# Patient Record
Sex: Male | Born: 1960 | Race: White | Hispanic: No | Marital: Married | Smoking: Light tobacco smoker
Health system: Southern US, Community
[De-identification: ages and names within clinical notes are randomized; demographics above are authoritative.]

## PROBLEM LIST (undated history)

## (undated) DIAGNOSIS — K219 Gastro-esophageal reflux disease without esophagitis: Secondary | ICD-10-CM

## (undated) DIAGNOSIS — D696 Thrombocytopenia, unspecified: Secondary | ICD-10-CM

## (undated) DIAGNOSIS — K648 Other hemorrhoids: Secondary | ICD-10-CM

## (undated) DIAGNOSIS — F329 Major depressive disorder, single episode, unspecified: Secondary | ICD-10-CM

## (undated) DIAGNOSIS — K625 Hemorrhage of anus and rectum: Secondary | ICD-10-CM

## (undated) DIAGNOSIS — G894 Chronic pain syndrome: Secondary | ICD-10-CM

## (undated) DIAGNOSIS — F319 Bipolar disorder, unspecified: Secondary | ICD-10-CM

## (undated) DIAGNOSIS — K8020 Calculus of gallbladder without cholecystitis without obstruction: Secondary | ICD-10-CM

## (undated) DIAGNOSIS — F419 Anxiety disorder, unspecified: Secondary | ICD-10-CM

## (undated) DIAGNOSIS — N400 Enlarged prostate without lower urinary tract symptoms: Secondary | ICD-10-CM

## (undated) DIAGNOSIS — E785 Hyperlipidemia, unspecified: Secondary | ICD-10-CM

## (undated) DIAGNOSIS — B182 Chronic viral hepatitis C: Secondary | ICD-10-CM

## (undated) DIAGNOSIS — K7460 Unspecified cirrhosis of liver: Secondary | ICD-10-CM

## (undated) DIAGNOSIS — N429 Disorder of prostate, unspecified: Secondary | ICD-10-CM

## (undated) DIAGNOSIS — F489 Nonpsychotic mental disorder, unspecified: Secondary | ICD-10-CM

## (undated) DIAGNOSIS — F1020 Alcohol dependence, uncomplicated: Secondary | ICD-10-CM

## (undated) DIAGNOSIS — I10 Essential (primary) hypertension: Secondary | ICD-10-CM

## (undated) DIAGNOSIS — N529 Male erectile dysfunction, unspecified: Secondary | ICD-10-CM

## (undated) DIAGNOSIS — E079 Disorder of thyroid, unspecified: Secondary | ICD-10-CM

## (undated) DIAGNOSIS — T7840XA Allergy, unspecified, initial encounter: Secondary | ICD-10-CM

## (undated) DIAGNOSIS — E229 Hyperfunction of pituitary gland, unspecified: Secondary | ICD-10-CM

## (undated) DIAGNOSIS — F1910 Other psychoactive substance abuse, uncomplicated: Secondary | ICD-10-CM

## (undated) DIAGNOSIS — E291 Testicular hypofunction: Secondary | ICD-10-CM

## (undated) DIAGNOSIS — D7589 Other specified diseases of blood and blood-forming organs: Secondary | ICD-10-CM

## (undated) DIAGNOSIS — F32A Depression, unspecified: Secondary | ICD-10-CM

## (undated) DIAGNOSIS — L309 Dermatitis, unspecified: Secondary | ICD-10-CM

## (undated) HISTORY — DX: Hemorrhage of anus and rectum: K62.5

## (undated) HISTORY — DX: Thrombocytopenia, unspecified: D69.6

## (undated) HISTORY — DX: Male erectile dysfunction, unspecified: N52.9

## (undated) HISTORY — DX: Hyperlipidemia, unspecified: E78.5

## (undated) HISTORY — DX: Disorder of thyroid, unspecified: E07.9

## (undated) HISTORY — DX: Disorder of prostate, unspecified: N42.9

## (undated) HISTORY — DX: Dermatitis, unspecified: L30.9

## (undated) HISTORY — DX: Other hemorrhoids: K64.8

## (undated) HISTORY — DX: Gastro-esophageal reflux disease without esophagitis: K21.9

## (undated) HISTORY — DX: Testicular hypofunction: E29.1

## (undated) HISTORY — DX: Alcohol dependence, uncomplicated: F10.20

## (undated) HISTORY — DX: Hyperfunction of pituitary gland, unspecified: E22.9

## (undated) HISTORY — DX: Chronic pain syndrome: G89.4

## (undated) HISTORY — DX: Other psychoactive substance abuse, uncomplicated: F19.10

## (undated) HISTORY — DX: Nonpsychotic mental disorder, unspecified: F48.9

## (undated) HISTORY — DX: Allergy, unspecified, initial encounter: T78.40XA

## (undated) HISTORY — DX: Other specified diseases of blood and blood-forming organs: D75.89

## (undated) HISTORY — DX: Calculus of gallbladder without cholecystitis without obstruction: K80.20

## (undated) HISTORY — DX: Bipolar disorder, unspecified: F31.9

## (undated) HISTORY — PX: HAND SURGERY: SHX662

## (undated) HISTORY — DX: Unspecified cirrhosis of liver: K74.60

## (undated) HISTORY — DX: Chronic viral hepatitis C: B18.2

## (undated) HISTORY — PX: OTHER SURGICAL HISTORY: SHX169

## (undated) HISTORY — DX: Benign prostatic hyperplasia without lower urinary tract symptoms: N40.0

---

## 2006-11-14 ENCOUNTER — Inpatient Hospital Stay: Payer: Self-pay | Admitting: Unknown Physician Specialty

## 2006-11-14 ENCOUNTER — Ambulatory Visit: Payer: Self-pay | Admitting: General Surgery

## 2006-12-26 ENCOUNTER — Emergency Department: Payer: Self-pay | Admitting: Unknown Physician Specialty

## 2008-09-19 ENCOUNTER — Emergency Department: Payer: Self-pay | Admitting: Emergency Medicine

## 2009-07-11 ENCOUNTER — Emergency Department: Payer: Self-pay | Admitting: Emergency Medicine

## 2009-12-25 ENCOUNTER — Emergency Department: Payer: Self-pay | Admitting: Emergency Medicine

## 2010-01-27 ENCOUNTER — Emergency Department: Payer: Self-pay | Admitting: Emergency Medicine

## 2010-03-27 ENCOUNTER — Other Ambulatory Visit: Payer: Self-pay | Admitting: Internal Medicine

## 2010-04-22 ENCOUNTER — Emergency Department: Payer: Self-pay | Admitting: Emergency Medicine

## 2010-08-21 ENCOUNTER — Inpatient Hospital Stay: Payer: Self-pay | Admitting: Psychiatry

## 2011-09-13 LAB — COMPREHENSIVE METABOLIC PANEL
Alkaline Phosphatase: 60 U/L (ref 50–136)
Anion Gap: 14 (ref 7–16)
BUN: 12 mg/dL (ref 7–18)
Calcium, Total: 7.4 mg/dL — ABNORMAL LOW (ref 8.5–10.1)
Chloride: 105 mmol/L (ref 98–107)
Co2: 23 mmol/L (ref 21–32)
Creatinine: 0.93 mg/dL (ref 0.60–1.30)
EGFR (African American): >60
Potassium: 3.5 mmol/L (ref 3.5–5.1)
SGOT(AST): 285 U/L — ABNORMAL HIGH (ref 15–37)
SGPT (ALT): 369 U/L — ABNORMAL HIGH
Sodium: 142 mmol/L (ref 136–145)

## 2011-09-13 LAB — LITHIUM LEVEL: Lithium: <0.2 mmol/L — ABNORMAL LOW

## 2011-09-13 LAB — DRUG SCREEN, URINE
Amphetamines, Ur Screen: NEGATIVE (ref ?–1000)
Benzodiazepine, Ur Scrn: NEGATIVE (ref ?–200)
Cannabinoid 50 Ng, Ur ~~LOC~~: NEGATIVE (ref ?–50)
Cocaine Metabolite,Ur ~~LOC~~: NEGATIVE (ref ?–300)
Opiate, Ur Screen: POSITIVE (ref ?–300)
Phencyclidine (PCP) Ur S: NEGATIVE (ref ?–25)
Tricyclic, Ur Screen: NEGATIVE (ref ?–1000)

## 2011-09-13 LAB — URINALYSIS, COMPLETE
Blood: NEGATIVE
Glucose,UR: NEGATIVE mg/dL (ref 0–75)
Hyaline Cast: 3
Ketone: NEGATIVE
Leukocyte Esterase: NEGATIVE
Ph: 6 (ref 4.5–8.0)
Protein: 30
WBC UR: 1 /HPF (ref 0–5)

## 2011-09-13 LAB — ETHANOL
Ethanol %: 0.166 % — ABNORMAL HIGH (ref 0.000–0.080)
Ethanol: 166 mg/dL

## 2011-09-13 LAB — CBC
HCT: 41.9 % (ref 40.0–52.0)
MCHC: 35 g/dL (ref 32.0–36.0)
RBC: 4.1 10*6/uL — ABNORMAL LOW (ref 4.40–5.90)
RDW: 12.1 % (ref 11.5–14.5)

## 2011-09-13 LAB — CK TOTAL AND CKMB (NOT AT ARMC): CK, Total: 140 U/L (ref 35–232)

## 2011-09-14 ENCOUNTER — Inpatient Hospital Stay: Payer: Self-pay | Admitting: Internal Medicine

## 2011-09-14 LAB — LIPID PANEL
Cholesterol: 168 mg/dL (ref 0–200)
HDL Cholesterol: 43 mg/dL (ref 40–60)
Ldl Cholesterol, Calc: 89 mg/dL (ref 0–100)

## 2011-09-14 LAB — TSH: Thyroid Stimulating Horm: 0.603 u[IU]/mL

## 2011-09-20 LAB — CULTURE, BLOOD (SINGLE)

## 2012-01-25 ENCOUNTER — Emergency Department: Payer: Self-pay | Admitting: Emergency Medicine

## 2012-01-25 LAB — URINALYSIS, COMPLETE
Bilirubin,UR: NEGATIVE
Glucose,UR: NEGATIVE mg/dL (ref 0–75)
Hyaline Cast: 4
Ketone: NEGATIVE
Leukocyte Esterase: NEGATIVE
Nitrite: NEGATIVE
Ph: 7 (ref 4.5–8.0)
Protein: NEGATIVE
RBC,UR: 1 /HPF (ref 0–5)
WBC UR: 1 /HPF (ref 0–5)

## 2012-01-25 LAB — COMPREHENSIVE METABOLIC PANEL
Albumin: 3.8 g/dL (ref 3.4–5.0)
Alkaline Phosphatase: 70 U/L (ref 50–136)
Bilirubin,Total: 0.3 mg/dL (ref 0.2–1.0)
Calcium, Total: 7.9 mg/dL — ABNORMAL LOW (ref 8.5–10.1)
Chloride: 102 mmol/L (ref 98–107)
Co2: 25 mmol/L (ref 21–32)
Creatinine: 0.96 mg/dL (ref 0.60–1.30)
EGFR (Non-African Amer.): >60
Osmolality: 281 (ref 275–301)
Potassium: 3.6 mmol/L (ref 3.5–5.1)
SGPT (ALT): 180 U/L — ABNORMAL HIGH (ref 12–78)
Sodium: 141 mmol/L (ref 136–145)
Total Protein: 7.2 g/dL (ref 6.4–8.2)

## 2012-01-25 LAB — DRUG SCREEN, URINE
Amphetamines, Ur Screen: NEGATIVE (ref ?–1000)
Benzodiazepine, Ur Scrn: NEGATIVE (ref ?–200)
Cocaine Metabolite,Ur ~~LOC~~: NEGATIVE (ref ?–300)
MDMA (Ecstasy)Ur Screen: NEGATIVE (ref ?–500)
Opiate, Ur Screen: POSITIVE (ref ?–300)
Phencyclidine (PCP) Ur S: NEGATIVE (ref ?–25)

## 2012-01-25 LAB — CBC
HCT: 44.9 % (ref 40.0–52.0)
HGB: 15.7 g/dL (ref 13.0–18.0)
MCH: 35.2 pg — ABNORMAL HIGH (ref 26.0–34.0)
MCHC: 35 g/dL (ref 32.0–36.0)
MCV: 101 fL — ABNORMAL HIGH (ref 80–100)
Platelet: 124 10*3/uL — ABNORMAL LOW (ref 150–440)
RBC: 4.46 10*6/uL (ref 4.40–5.90)
RDW: 12.3 % (ref 11.5–14.5)
WBC: 5.4 10*3/uL (ref 3.8–10.6)

## 2012-01-25 LAB — SALICYLATE LEVEL: Salicylates, Serum: 2.3 mg/dL

## 2012-01-25 LAB — ETHANOL
Ethanol %: 0.09 % — ABNORMAL HIGH (ref 0.000–0.080)
Ethanol: 90 mg/dL

## 2012-01-25 LAB — TSH: Thyroid Stimulating Horm: 2.99 u[IU]/mL

## 2012-04-13 DIAGNOSIS — K7460 Unspecified cirrhosis of liver: Secondary | ICD-10-CM

## 2012-04-13 HISTORY — DX: Unspecified cirrhosis of liver: K74.60

## 2012-07-15 ENCOUNTER — Emergency Department: Payer: Self-pay | Admitting: Emergency Medicine

## 2012-07-15 LAB — URINALYSIS, COMPLETE
Bacteria: NONE SEEN
Bilirubin,UR: NEGATIVE
Glucose,UR: NEGATIVE mg/dL (ref 0–75)
Ketone: NEGATIVE
Leukocyte Esterase: NEGATIVE
Ph: 7 (ref 4.5–8.0)
Protein: NEGATIVE
RBC,UR: <1 /HPF (ref 0–5)
Specific Gravity: 1.003 (ref 1.003–1.030)
WBC UR: NONE SEEN /HPF (ref 0–5)

## 2012-07-15 LAB — ETHANOL
Ethanol %: 0.213 % — ABNORMAL HIGH (ref 0.000–0.080)
Ethanol: 213 mg/dL

## 2012-07-15 LAB — DRUG SCREEN, URINE
Amphetamines, Ur Screen: NEGATIVE (ref ?–1000)
Cannabinoid 50 Ng, Ur ~~LOC~~: NEGATIVE (ref ?–50)
Cocaine Metabolite,Ur ~~LOC~~: NEGATIVE (ref ?–300)
Methadone, Ur Screen: NEGATIVE (ref ?–300)
Opiate, Ur Screen: NEGATIVE (ref ?–300)
Tricyclic, Ur Screen: NEGATIVE (ref ?–1000)

## 2012-07-15 LAB — CBC
HCT: 46.2 % (ref 40.0–52.0)
MCHC: 34.6 g/dL (ref 32.0–36.0)
RBC: 4.53 10*6/uL (ref 4.40–5.90)

## 2012-07-15 LAB — COMPREHENSIVE METABOLIC PANEL
BUN: 11 mg/dL (ref 7–18)
Bilirubin,Total: 0.5 mg/dL (ref 0.2–1.0)
Creatinine: 0.87 mg/dL (ref 0.60–1.30)
Osmolality: 277 (ref 275–301)
SGOT(AST): 207 U/L — ABNORMAL HIGH (ref 15–37)

## 2012-08-11 ENCOUNTER — Emergency Department: Payer: Self-pay | Admitting: Emergency Medicine

## 2012-08-11 LAB — COMPREHENSIVE METABOLIC PANEL
Albumin: 3.6 g/dL (ref 3.4–5.0)
Alkaline Phosphatase: 78 U/L (ref 50–136)
Anion Gap: 11 (ref 7–16)
BUN: 13 mg/dL (ref 7–18)
Bilirubin,Total: 0.4 mg/dL (ref 0.2–1.0)
Chloride: 105 mmol/L (ref 98–107)
Creatinine: 0.87 mg/dL (ref 0.60–1.30)
EGFR (African American): >60
EGFR (Non-African Amer.): >60
SGOT(AST): 356 U/L — ABNORMAL HIGH (ref 15–37)
Sodium: 138 mmol/L (ref 136–145)

## 2012-08-11 LAB — DRUG SCREEN, URINE
Barbiturates, Ur Screen: NEGATIVE (ref ?–200)
Benzodiazepine, Ur Scrn: NEGATIVE (ref ?–200)
MDMA (Ecstasy)Ur Screen: NEGATIVE (ref ?–500)
Phencyclidine (PCP) Ur S: NEGATIVE (ref ?–25)

## 2012-08-11 LAB — CK TOTAL AND CKMB (NOT AT ARMC): CK, Total: 178 U/L (ref 35–232)

## 2012-08-11 LAB — CBC
HCT: 45 % (ref 40.0–52.0)
MCH: 36 pg — ABNORMAL HIGH (ref 26.0–34.0)
MCHC: 35 g/dL (ref 32.0–36.0)
MCV: 103 fL — ABNORMAL HIGH (ref 80–100)
Platelet: 131 10*3/uL — ABNORMAL LOW (ref 150–440)
RBC: 4.38 10*6/uL — ABNORMAL LOW (ref 4.40–5.90)
RDW: 12.2 % (ref 11.5–14.5)
WBC: 8.7 10*3/uL (ref 3.8–10.6)

## 2012-08-11 LAB — URINALYSIS, COMPLETE
Bilirubin,UR: NEGATIVE
Hyaline Cast: 1
Ketone: NEGATIVE
Leukocyte Esterase: NEGATIVE
Protein: 30
Specific Gravity: 1.01 (ref 1.003–1.030)
WBC UR: NONE SEEN /HPF (ref 0–5)

## 2012-08-11 LAB — ETHANOL: Ethanol %: 0.21 % — ABNORMAL HIGH (ref 0.000–0.080)

## 2012-08-11 LAB — PROTIME-INR: Prothrombin Time: 13.5 secs (ref 11.5–14.7)

## 2012-08-11 LAB — TROPONIN I: Troponin-I: <0.02 ng/mL

## 2012-10-20 ENCOUNTER — Emergency Department: Payer: Self-pay | Admitting: Emergency Medicine

## 2012-10-20 LAB — CBC
HCT: 48.6 % (ref 40.0–52.0)
HGB: 16.8 g/dL (ref 13.0–18.0)
MCH: 35.2 pg — ABNORMAL HIGH (ref 26.0–34.0)
Platelet: 135 10*3/uL — ABNORMAL LOW (ref 150–440)
RBC: 4.77 10*6/uL (ref 4.40–5.90)
RDW: 11.7 % (ref 11.5–14.5)
WBC: 8.5 10*3/uL (ref 3.8–10.6)

## 2012-10-20 LAB — COMPREHENSIVE METABOLIC PANEL
Albumin: 4 g/dL (ref 3.4–5.0)
Alkaline Phosphatase: 90 U/L (ref 50–136)
Anion Gap: 5 — ABNORMAL LOW (ref 7–16)
BUN: 12 mg/dL (ref 7–18)
Bilirubin,Total: 0.8 mg/dL (ref 0.2–1.0)
Calcium, Total: 9 mg/dL (ref 8.5–10.1)
Chloride: 102 mmol/L (ref 98–107)
Creatinine: 0.84 mg/dL (ref 0.60–1.30)
EGFR (African American): >60
Osmolality: 268 (ref 275–301)
SGPT (ALT): 409 U/L — ABNORMAL HIGH (ref 12–78)

## 2012-10-20 LAB — TSH: Thyroid Stimulating Horm: 2.42 u[IU]/mL

## 2012-10-20 LAB — URINALYSIS, COMPLETE
Bacteria: NONE SEEN
Bilirubin,UR: NEGATIVE
Glucose,UR: NEGATIVE mg/dL (ref 0–75)
Leukocyte Esterase: NEGATIVE
Nitrite: NEGATIVE
Ph: 5 (ref 4.5–8.0)
Protein: NEGATIVE
Squamous Epithelial: NONE SEEN
WBC UR: 1 /HPF (ref 0–5)

## 2012-10-20 LAB — DRUG SCREEN, URINE
Amphetamines, Ur Screen: NEGATIVE (ref ?–1000)
Barbiturates, Ur Screen: NEGATIVE (ref ?–200)
Benzodiazepine, Ur Scrn: NEGATIVE (ref ?–200)
Cannabinoid 50 Ng, Ur ~~LOC~~: NEGATIVE (ref ?–50)
Cocaine Metabolite,Ur ~~LOC~~: NEGATIVE (ref ?–300)
Opiate, Ur Screen: NEGATIVE (ref ?–300)
Phencyclidine (PCP) Ur S: NEGATIVE (ref ?–25)
Tricyclic, Ur Screen: NEGATIVE (ref ?–1000)

## 2012-10-20 LAB — ETHANOL: Ethanol: 90 mg/dL

## 2013-03-06 ENCOUNTER — Emergency Department: Payer: Self-pay | Admitting: Emergency Medicine

## 2013-03-06 LAB — URINALYSIS, COMPLETE
Blood: NEGATIVE
Glucose,UR: NEGATIVE mg/dL (ref 0–75)
Nitrite: NEGATIVE
Ph: 5 (ref 4.5–8.0)
Specific Gravity: 1.009 (ref 1.003–1.030)
Squamous Epithelial: NONE SEEN
WBC UR: 4 /HPF (ref 0–5)

## 2013-03-06 LAB — DRUG SCREEN, URINE
Benzodiazepine, Ur Scrn: NEGATIVE (ref ?–200)
Cannabinoid 50 Ng, Ur ~~LOC~~: NEGATIVE (ref ?–50)
MDMA (Ecstasy)Ur Screen: NEGATIVE (ref ?–500)
Methadone, Ur Screen: NEGATIVE (ref ?–300)
Opiate, Ur Screen: POSITIVE (ref ?–300)
Tricyclic, Ur Screen: NEGATIVE (ref ?–1000)

## 2013-03-06 LAB — ACETAMINOPHEN LEVEL: Acetaminophen: <2 ug/mL

## 2013-03-06 LAB — COMPREHENSIVE METABOLIC PANEL
BUN: 10 mg/dL (ref 7–18)
Calcium, Total: 8.6 mg/dL (ref 8.5–10.1)
Chloride: 101 mmol/L (ref 98–107)
Co2: 29 mmol/L (ref 21–32)
Creatinine: 1.07 mg/dL (ref 0.60–1.30)
EGFR (African American): >60
EGFR (Non-African Amer.): >60
Glucose: 157 mg/dL — ABNORMAL HIGH (ref 65–99)
Potassium: 3.6 mmol/L (ref 3.5–5.1)

## 2013-03-06 LAB — ETHANOL
Ethanol %: 0.19 % — ABNORMAL HIGH (ref 0.000–0.080)
Ethanol: 190 mg/dL

## 2013-03-06 LAB — CBC
HCT: 48.2 % (ref 40.0–52.0)
MCHC: 33.9 g/dL (ref 32.0–36.0)
RBC: 4.51 10*6/uL (ref 4.40–5.90)
RDW: 12.4 % (ref 11.5–14.5)
WBC: 10.9 10*3/uL — ABNORMAL HIGH (ref 3.8–10.6)

## 2013-03-06 LAB — SALICYLATE LEVEL: Salicylates, Serum: 1.9 mg/dL

## 2013-08-25 ENCOUNTER — Ambulatory Visit: Payer: Self-pay | Admitting: Family Medicine

## 2013-09-20 DIAGNOSIS — R748 Abnormal levels of other serum enzymes: Secondary | ICD-10-CM | POA: Insufficient documentation

## 2013-09-20 DIAGNOSIS — K7460 Unspecified cirrhosis of liver: Secondary | ICD-10-CM | POA: Insufficient documentation

## 2013-09-25 ENCOUNTER — Emergency Department: Payer: Self-pay | Admitting: Emergency Medicine

## 2013-09-25 LAB — ETHANOL
ETHANOL LVL: 208 mg/dL
Ethanol %: 0.208 % — ABNORMAL HIGH (ref 0.000–0.080)

## 2013-09-25 LAB — CBC
HCT: 42.4 % (ref 40.0–52.0)
HGB: 13.9 g/dL (ref 13.0–18.0)
MCH: 35.4 pg — ABNORMAL HIGH (ref 26.0–34.0)
MCHC: 32.8 g/dL (ref 32.0–36.0)
MCV: 108 fL — ABNORMAL HIGH (ref 80–100)
PLATELETS: 50 10*3/uL — AB (ref 150–440)
RBC: 3.93 10*6/uL — AB (ref 4.40–5.90)
RDW: 12.8 % (ref 11.5–14.5)
WBC: 3.9 10*3/uL (ref 3.8–10.6)

## 2013-09-25 LAB — URINALYSIS, COMPLETE
Bacteria: NONE SEEN
Bilirubin,UR: NEGATIVE
Glucose,UR: NEGATIVE mg/dL (ref 0–75)
Ketone: NEGATIVE
Leukocyte Esterase: NEGATIVE
NITRITE: NEGATIVE
PH: 5 (ref 4.5–8.0)
Protein: 100
RBC,UR: 4 /HPF (ref 0–5)
SPECIFIC GRAVITY: 1.008 (ref 1.003–1.030)
Squamous Epithelial: NONE SEEN

## 2013-09-25 LAB — DRUG SCREEN, URINE
Amphetamines, Ur Screen: NEGATIVE (ref ?–1000)
BENZODIAZEPINE, UR SCRN: NEGATIVE (ref ?–200)
Barbiturates, Ur Screen: NEGATIVE (ref ?–200)
Cannabinoid 50 Ng, Ur ~~LOC~~: NEGATIVE (ref ?–50)
Cocaine Metabolite,Ur ~~LOC~~: NEGATIVE (ref ?–300)
MDMA (Ecstasy)Ur Screen: NEGATIVE (ref ?–500)
Methadone, Ur Screen: NEGATIVE (ref ?–300)
Opiate, Ur Screen: POSITIVE (ref ?–300)
Phencyclidine (PCP) Ur S: NEGATIVE (ref ?–25)
TRICYCLIC, UR SCREEN: NEGATIVE (ref ?–1000)

## 2013-09-25 LAB — SALICYLATE LEVEL: Salicylates, Serum: 2.4 mg/dL

## 2013-09-25 LAB — COMPREHENSIVE METABOLIC PANEL
ANION GAP: 14 (ref 7–16)
Albumin: 3.2 g/dL — ABNORMAL LOW (ref 3.4–5.0)
Alkaline Phosphatase: 76 U/L
BUN: 6 mg/dL — ABNORMAL LOW (ref 7–18)
Bilirubin,Total: 1.1 mg/dL — ABNORMAL HIGH (ref 0.2–1.0)
CALCIUM: 8 mg/dL — AB (ref 8.5–10.1)
CHLORIDE: 106 mmol/L (ref 98–107)
CO2: 21 mmol/L (ref 21–32)
Creatinine: 0.64 mg/dL (ref 0.60–1.30)
EGFR (African American): >60
EGFR (Non-African Amer.): >60
Glucose: 117 mg/dL — ABNORMAL HIGH (ref 65–99)
Osmolality: 280 (ref 275–301)
Potassium: 3.1 mmol/L — ABNORMAL LOW (ref 3.5–5.1)
SGOT(AST): 288 U/L — ABNORMAL HIGH (ref 15–37)
SGPT (ALT): 174 U/L — ABNORMAL HIGH (ref 12–78)
Sodium: 141 mmol/L (ref 136–145)
Total Protein: 7.5 g/dL (ref 6.4–8.2)

## 2013-09-25 LAB — TROPONIN I: Troponin-I: <0.02 ng/mL

## 2013-09-25 LAB — ACETAMINOPHEN LEVEL: Acetaminophen: 4 ug/mL — ABNORMAL LOW

## 2013-09-25 LAB — LIPASE, BLOOD: LIPASE: 238 U/L (ref 73–393)

## 2013-12-27 ENCOUNTER — Ambulatory Visit: Payer: Self-pay | Admitting: Urology

## 2014-01-08 DIAGNOSIS — B182 Chronic viral hepatitis C: Secondary | ICD-10-CM | POA: Insufficient documentation

## 2014-03-12 ENCOUNTER — Ambulatory Visit: Payer: Self-pay | Admitting: Gastroenterology

## 2014-08-03 NOTE — Consult Note (Signed)
Brief Consult Note: Diagnosis: opiate abuse, alcohol dep.   Patient was seen by consultant.   Consult note dictated.   Discussed with Attending MD.   Comments: Psychiatry: Patient seen. Reviewed chart. Patient was intoxicated and took accidental od of heroin. No symptoms of depression, no SI, no psychosis. Alcohol regularly but does not want to quit and is not sick froom it. No indication for inpt treatment.Discussed with Dr Mayford KnifeWilliams and dced the IVC.  Electronic Signatures: Audery Amellapacs, John T (MD)  (Signed [phone] [id number]-May-14 16:22)  Authored: Brief Consult Note   Last Updated: 02-May-14 16:22 by Audery Amellapacs, John T (MD)

## 2014-08-03 NOTE — Consult Note (Signed)
PATIENT NAME:  Kyle Gomez, Kyle Gomez MR#:  [id number] DATE OF BIRTH:  Dec 27, 1960  DATE OF CONSULTATION:  08/12/2012  CONSULTING PHYSICIAN:  Audery AmelJohn T. Kyle Yang, MD  IDENTIFYING INFORMATION AND REASON FOR CONSULT: A 54 year old man who came to the Emergency Room last night after taking an overdose of heroin. Consultation for appropriate psychiatric disposition.   HISTORY OF PRESENT ILLNESS:  Information obtained from the patient and from the chart. The patient tells me that he was at his house last night, going about his business, but had consumed about a 6-pack of beer when a close friend of his came by to visit. This close friend happens to be a regular heroin user, and offered the patient some IV heroin to use. The patient says that it was because he had already been drinking and his better judgment was impaired that he chose to go ahead and use some. He totally denies that he was trying to kill himself or wishing himself any harm. He just wanted to get high. The patient says that his mood otherwise recently has been pretty good. Does not describe depression. Does not describe any hallucinations or psychotic symptoms or agitation or mental health problems at all. His usual alcohol intake, by his reckoning, is about a 6-pack on the weekends, although that would mean that his weekend must start on a Thursday, and 2 or 3 beers on the other days. He has no interest in stopping drinking. He is not currently getting any outpatient psychiatric treatment.   PAST PSYCHIATRIC HISTORY:  Multiple prior visits to the hospital here because of alcohol abuse. He has been detoxed on some occasions in the past. He also has a past history of a traumatic brain injury, and has had depressive symptoms in the past, although by and large they seemed to have cleared up when he is sober. No history of suicide attempts. No history of violence.   PAST MEDICAL HISTORY: The patient says he has high blood pressure, and also has a past history of a  traumatic brain injury, but he is not currently getting any medical treatment for anything at all.   MEDICATIONS:  He is on no medicines currently.   ALLERGIES:  No known drug allergies.   SOCIAL HISTORY:  Patent rents a house, and it sounds like he has some roommates who live with him. He is on disability, social security. He describes himself as spending most of his time puttering around the house keeping busy.   MENTAL STATUS EXAMINATION: Neatly dressed and groomed man, evaluated in the Emergency Room. He was wide awake, and pleasant and cooperative with the interview. Made good eye contact. Appropriate psychomotor activity, given the situation. Speech was normal in rate, tone and volume. Affect reactive, full range, appropriate. Mood stated as being fine. Thoughts appear lucid and directed. No evidence of delusional thinking. Denies auditory or visual hallucinations. Denies any suicidal or homicidal ideation. Appears to be of normal intelligence, reasonable judgment and insight, except insofar as being the sort of person who would use IV heroin on a whim.   ASSESSMENT: A 54 year old man who presented after an accidental opiate overdose. He is fully recovered from that. He is now back to his baseline mental state. He has not had a seizure and does not appear to be shaky or showing any obvious outward signs of alcohol withdrawal. He has no interest in stopping drinking, and feels that he functions perfectly fine at his current level of use. He denies any depressive symptoms. No  evidence of any other acute mental health problems. No longer meets commitment criteria.   TREATMENT PLAN:  Psychoeducation was completed regarding the dangers of intravenous heroin use and, in fact, of any opiate abuse. The patient acknowledges all this, and says that he will never do this again. Encouraged patient to follow up with [facility], as he has been seen there for his mental health issues in the past. No other immediate  treatment at this point.   Case discussed with the Emergency Room attending, Dr. Mayford Knife. I recommend that we take him off commitment criteria, and patient can be discharged and given a voluntary referral for outpatient treatment.   DIAGNOSIS, PRINCIPAL AND PRIMARY:  AXIS I:  Opiate abuse.   SECONDARY DIAGNOSES:  AXIS I:  Alcohol dependence.   AXIS II:  Deferred.   AXIS III:  Status post recovery from narcotic overdose, hypertension, history of head injury.   AXIS IV:  Moderate chronic stress from his disability.   AXIS V:  Functioning at time of evaluation 61.     ____________________________ Audery Amel, MD jtc:mr D: 08/12/2012 16:28:48 ET T: 08/12/2012 22:03:34 ET JOB#: [id number]  cc: Audery Amel, MD, <Dictator> Audery Amel MD ELECTRONICALLY SIGNED 08/13/2012 14:26
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
## 2014-08-05 NOTE — Discharge Summary (Signed)
PATIENT NAME:  Kyle Gomez, Kyle Gomez MR#:  [id number] DATE OF BIRTH:  07/08/1960  DATE OF ADMISSION:  09/14/2011 DATE OF DISCHARGE (AGAINST MEDICAL ADVICE):  09/15/2011  PRIMARY CARE PHYSICIAN: None.   DISCHARGE DIAGNOSES:  1. Unresponsiveness likely due to alcohol abuse/drug abuse.  2. Acute respiratory failure, now resolved, likely due to pneumonia.  3. Pneumonia, on antibiotics, likely aspiration variety.  4. Alcohol and drug abuse.  5. Tobacco abuse. Patient was counseled for three minutes.   SECONDARY DIAGNOSES:  1. Hypertension.  2. Depression. 3. Benign prostatic hypertrophy.  4. Alcoholism. 5. Hepatitis C.   CONSULTATION: Psych, Dr. Jennet MaduroPucilowska.   LABORATORY, DIAGNOSTIC AND RADIOLOGICAL DATA: CT scan of the head without contrast on 06/02 showed no acute abnormality. Chest x-ray on 06/02 showed no acute cardiopulmonary disease. Chest x-ray on 06/03 showed bilateral pneumonia which was no present on previous chest x-rays.   Urinalysis on admission was negative. Blood cultures x2 were negative.   HISTORY AND SHORT HOSPITAL COURSE: Patient is a 54 year old male with known history of alcoholism was admitted for possible alcohol abuse and unresponsiveness. Please see Dr. Nicky Pughhen's dictated history and physical for further details. On admission he was found to have acute respiratory failure but no definite pneumonia was seen on initial chest x-ray. Subsequent x-ray on 06/04 showed bilateral pneumonia, was started on antibiotics. This was thought to be possibly aspiration related as patient did have vomiting. His alcohol level was elevated with a value of 0.166. Patient was evaluated by Dr. Jennet MaduroPucilowska on 06/03 and was not found to be meeting criteria for involuntary commitment. Patient also refused substance abuse treatment, neither was he interested in starting any psychotropic medication. Patient was placed on CIWA protocol and was continued on 06/04. Patient left AGAINST MEDICAL ADVICE in spite of  talking to him, he was very adamant to leave and no prescriptions were filled for him.  TOTAL TIME TAKING CARE OF THIS PATIENT: 30 minutes.  ____________________________ Ellamae SiaVipul S. Sherryll BurgerShah, MD vss:cms D: 09/16/2011 13:06:05 ET T: 09/16/2011 15:58:22 ET JOB#: [id number]  cc: Delcia Spitzley S. Sherryll BurgerShah, MD, <Dictator>  Ellamae SiaVIPUL S [facility] MD ELECTRONICALLY SIGNED 09/16/2011 19:30
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
## 2014-08-05 NOTE — H&P (Signed)
PATIENT NAME:  Kyle Gomez, Kyle Gomez MR#:  [id number] DATE OF BIRTH:  [birth year]  DATE OF ADMISSION:  09/14/2011  PRIMARY CARE PHYSICIAN: None local.   REFERRING PHYSICIAN: Janalyn Harderavid Kaminski, MD  CHIEF COMPLAINT: Blacked out today.   HISTORY OF PRESENT ILLNESS: This is a 54 year old male with a history of hypertension, depression, alcohol abuse, benign prostatic hypertrophy, prostate cancer, and hepatitis C who presented to the ED with the above chief complaint. The patient is awake but does not know what happened to him. According to Dr. Carollee MassedKaminski, the patient was noted to be unresponsive and then EMS was called. The patient was found to have cyanosis. He was treated with clearing the airway and Narcan then was sent to the ED for further evaluation. The patient was more alert in the ED but oxygen saturation was 85% on 4 liters of oxygen and vomited a lot. He was suspected to have aspiration, however, a chest x-ray was normal. According to patient, he does not know what happened this time. He said he had a blackout, but he mentioned he drinks alcohol every day. His last drink was today. He also complains of headache, dizziness, and nausea but denies any other symptoms. No weakness or urinary incontinence. No seizure. CAT scan of head is normal. He is living in a boarding house. He used methadone several days ago.   PAST MEDICAL HISTORY:  1. Hypertension. 2. Depression. 3. Benign prostatic hypertrophy or prostate cancer; the patient is not sure. 4. Alcoholism. 5. Hepatitis C. 6. According to previous document, the patient also has a mood disorder and was treated with lithium, but the patient said he is not taking any medication now because he cannot afford it.   SOCIAL HISTORY: He has smoked 1 pack a day for more than 40 years. He drinks alcohol every day. He used methadone several days ago, but denies any other drug abuse.   FAMILY HISTORY: Both parents are deceased, but he said there is history of  hypertension, diabetes, and cancer in his family.   REVIEW OF SYSTEMS: CONSTITUTIONAL: The patient denies any fever or chills, but has headache and dizziness. No weakness. EYES: No double vision or blurred vision. ENT: No postnasal drip, epistaxis, or ear pain. CARDIOVASCULAR: No chest pain, palpitations, orthopnea, or nocturnal dyspnea. RESPIRATORY: No cough, sputum, shortness of breath, or hemoptysis. GASTROINTESTINAL: Positive for nausea, but no abdominal pain. Positive for nausea and vomiting but no abdominal pain, diarrhea, melena, or bloody stool. GENITOURINARY: No dysuria, hematuria, or incontinence. SKIN: No rash or jaundice. HEMATOLOGY: No easy bruising or bleeding. NEUROLOGY: Positive for syncope and loss of consciousness, but no seizure. PSYCH: Positive depression and anxiety.  ALLERGIES: No known drug allergies.   MEDICATIONS: The patient is not taking any medications.   PHYSICAL EXAMINATION:   VITAL SIGNS: Temperature 98.2, blood pressure 123/73, respirations 18, and oxygen saturation 94% on oxygen. The patient's blood pressure was 170/102.   GENERAL: This patient is alert, awake, and oriented, and in no acute distress.   HEENT: Pupils are round, equal, and reactive to light and accommodation. Moist oral mucosa. Clear oropharynx.   NECK: Supple. No JVD or carotid bruit. No lymphadenopathy or thyromegaly.   CARDIOVASCULAR: S1 and S2 regular rate and rhythm. No murmurs or gallops.   PULMONARY: Bilateral air entry. No wheezing or rales. No use of accessory muscles to breathe.   ABDOMEN: Soft. No distention. No tenderness. No organomegaly. Bowel sounds present.   EXTREMITIES: No edema, clubbing, or cyanosis. No calf tenderness. Strong  bilateral pedal pulses.   SKIN: No rash or jaundice.   NEUROLOGIC: Alert and oriented x3. No focal deficits. Power 5/5. Sensation intact. Deep tendon reflexes 2+. The patient has mild slurred speech, but he said it is at baseline.   LABS/STUDIES:  EKG shows sinus tachycardia at 107 beats per minute.   Urine toxicology showed opiates positive.   Urinalysis negative.   CK 140 and CK/MB 2.1. Troponin less than 0.02. WBC 6.1, hemoglobin 14.7, and platelets 149. Glucose 153, BUN 12, creatinine 0.93, potassium 3.5, sodium 142. Ethanol level 166. Lithium less than 0.20.  IMPRESSION:  1. Unresponsiveness possible may due to ethanol abuse or drug abuse.  2. Acute respiratory failure, now resolved, possibly due to above mentioned diagnosis. 3. Alcohol abuse.  4. Drug abuse.  5. Alcohol abuse.  6. Hypertension.  7. Depression  and anxiety. 8. History of benign prostatic hypertrophy or prostate cancer.  9. History of hepatitis C.  PLAN OF TREATMENT: The patient will be admitted to medical floor. We will continue telemetry monitor, O2 by nasal cannula, aspiration and fall precautions. We will start CIWA protocol for alcohol abuse to prevent alcohol withdrawal and repeat chest x-ray in the morning. For hypertension, the patient's blood pressure now is under control. We will start Lopressor. For depression, the patient may need psych consult tomorrow if the patient still feels depressed. For tobacco abuse, smoking cessation and was counseled. GI and deep vein thrombosis prophylaxis.            I discussed the patient's situation and plan of treatment with the patient.   TIME SPENT: About 65 minutes. ____________________________ Shaune Pollack, MD qc:slb D: 09/13/2011 23:25:49 ET T: 09/14/2011 07:52:31 ET JOB#: [id number]  cc: Shaune Pollack, MD, <Dictator> Shaune Pollack MD ELECTRONICALLY SIGNED 09/15/2011 2:36

## 2014-08-05 NOTE — Consult Note (Signed)
Brief Consult Note: Diagnosis: Alcohol dependence, Mood disorder NOS, antisocial personality disorder.   Patient was seen by consultant.   Consult note dictated.   Recommend further assessment or treatment.   Orders entered.   Discussed with Attending MD.   Comments: [name] has a long h/o polysubstance abuse, mood instability and antisocial behaviors. He was admitted to medical floor for respiratory failure in the context of alcohol and narcotic painkillers abuse. The patient adamantly denies any intention to hurt himnself or others. He admits to daily drinking and use of pain killers for undefined pain.  PLAN: 1. [name] does not meet criteria for IVC.  2. He declines substance abuse treatment. He is not interested in restarting any psychotropic medications as he is not able to attend it.   3. Please continue CIWA.   4. I will sign off. Please call me if the patient changes his mind about substance abuse treatment.  Electronic Signatures: Kristine LineaPucilowska, Claudio Mondry (MD)  (Signed 03-Jun-13 13:59)  Authored: Brief Consult Note   Last Updated: 03-Jun-13 13:59 by Kristine LineaPucilowska, Lexey Fletes (MD)

## 2014-09-18 ENCOUNTER — Telehealth: Payer: Self-pay | Admitting: Family Medicine

## 2014-09-18 DIAGNOSIS — G894 Chronic pain syndrome: Secondary | ICD-10-CM

## 2014-09-18 MED ORDER — OXYCODONE HCL 10 MG PO TABS: 10.0000 mg | ORAL_TABLET | Freq: Four times a day (QID) | ORAL | Status: DC | PRN

## 2014-09-18 NOTE — Telephone Encounter (Signed)
Verified refill history of [facility], last Rx Oxycodone 10mg  #124 was on 08/24/14. New Rx printed out to be filled 09/21/14. Rx ready to be picked up let patient know.

## 2014-09-19 NOTE — Telephone Encounter (Signed)
Patient was informed and will come by to pick up his new rx before he goes out of town on Friday.

## 2014-10-10 ENCOUNTER — Telehealth: Payer: Self-pay | Admitting: Family Medicine

## 2014-10-10 NOTE — Telephone Encounter (Signed)
Patient made appointment for July 1, for re-evaluation.

## 2014-10-10 NOTE — Telephone Encounter (Signed)
Pt's wife called stating that he was in incredible pain. She was wondering if he could get something stronger for the pain.

## 2014-10-10 NOTE — Telephone Encounter (Signed)
Pt's wife called back and wanted to let Dr. Sherley BoundsSundaram know that his thyroid medication has been upped.

## 2014-10-10 NOTE — Telephone Encounter (Signed)
Please let patient and wife know that I will not be able to prescribe anything stronger for pain needs as it is beyond my expertise and if his pain worsens he will have to be re-evaluated and I may have to send him to a pain management specialist.

## 2014-10-11 ENCOUNTER — Other Ambulatory Visit: Payer: Self-pay | Admitting: Family Medicine

## 2014-10-11 DIAGNOSIS — K8020 Calculus of gallbladder without cholecystitis without obstruction: Secondary | ICD-10-CM | POA: Insufficient documentation

## 2014-10-11 DIAGNOSIS — N400 Enlarged prostate without lower urinary tract symptoms: Secondary | ICD-10-CM

## 2014-10-11 DIAGNOSIS — E039 Hypothyroidism, unspecified: Secondary | ICD-10-CM

## 2014-10-11 DIAGNOSIS — B182 Chronic viral hepatitis C: Secondary | ICD-10-CM

## 2014-10-11 DIAGNOSIS — D696 Thrombocytopenia, unspecified: Secondary | ICD-10-CM

## 2014-10-11 DIAGNOSIS — F1920 Other psychoactive substance dependence, uncomplicated: Secondary | ICD-10-CM

## 2014-10-11 DIAGNOSIS — K649 Unspecified hemorrhoids: Secondary | ICD-10-CM

## 2014-10-12 ENCOUNTER — Ambulatory Visit (INDEPENDENT_AMBULATORY_CARE_PROVIDER_SITE_OTHER): Payer: Medicaid Other | Admitting: Family Medicine

## 2014-10-12 ENCOUNTER — Encounter: Payer: Self-pay | Admitting: Family Medicine

## 2014-10-12 VITALS — BP 123/78 | HR 66 | Temp 98.2°F | Resp 18 | Ht 73.0 in | Wt 215.0 lb

## 2014-10-12 DIAGNOSIS — K7460 Unspecified cirrhosis of liver: Secondary | ICD-10-CM

## 2014-10-12 DIAGNOSIS — D696 Thrombocytopenia, unspecified: Secondary | ICD-10-CM | POA: Diagnosis not present

## 2014-10-12 DIAGNOSIS — G894 Chronic pain syndrome: Secondary | ICD-10-CM | POA: Diagnosis not present

## 2014-10-12 DIAGNOSIS — F1920 Other psychoactive substance dependence, uncomplicated: Secondary | ICD-10-CM | POA: Diagnosis not present

## 2014-10-12 DIAGNOSIS — K8020 Calculus of gallbladder without cholecystitis without obstruction: Secondary | ICD-10-CM

## 2014-10-12 DIAGNOSIS — B182 Chronic viral hepatitis C: Secondary | ICD-10-CM

## 2014-10-12 MED ORDER — OXYCODONE HCL 15 MG PO TABS: 15.0000 mg | ORAL_TABLET | Freq: Four times a day (QID) | ORAL | Status: DC | PRN
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
## 2014-10-12 NOTE — Patient Instructions (Signed)
Cirrhosis  Cirrhosis is a condition of scarring of the liver which is caused when the liver has tried repairing itself following damage. This damage may come from a previous infection such as one of the forms of hepatitis (usually hepatitis C), or the damage may come from being injured by toxins. The main toxin that causes this damage is alcohol. The scarring of the liver from use of alcohol is irreversible. That means the liver cannot return to normal even though alcohol is not used any more. The main danger of hepatitis C infection is that it may cause long-lasting (chronic) liver disease, and this also may lead to cirrhosis. This complication is progressive and irreversible.  CAUSES   Prior to available blood tests, hepatitis C could be contracted by blood transfusions. Since testing of blood has improved, this is now unlikely. This infection can also be contracted through intravenous drug use and the sharing of needles. It can also be contracted through sexual relationships. The injury caused by alcohol comes from too much use. It is not a few drinks that poison the liver, but years of misuse. Usually there will be some signs and symptoms early with scarring of the liver that suggest the development of better habits. Alcohol should never be used while using acetaminophen. A small dose of both taken together may cause irreversible damage to the liver.  HOME CARE INSTRUCTIONS   There is no specific treatment for cirrhosis. However, there are things you can do to avoid making the condition worse.  · Rest as needed.  · Eat a well-balanced diet. Your caregiver can help you with suggestions.  · Vitamin supplements including vitamins A, K, D, and thiamine can help.  · A low-salt diet, water restriction, or diuretic medicine may be needed to reduce fluid retention.  · Avoid alcohol. This can be extremely toxic if combined with acetaminophen.  · Avoid drugs which are toxic to the liver. Some of these include isoniazid,  methyldopa, acetaminophen, anabolic steroids (muscle-building drugs), erythromycin, and oral contraceptives (birth control pills). Check with your caregiver to make sure medicines you are presently taking will not be harmful.  · Periodic blood tests may be required. Follow your caregiver's advice regarding the timing of these.  · Milk thistle is an herbal remedy which does protect the liver against toxins. However, it will not help once the liver has been scarred.  SEEK MEDICAL CARE IF:  · You have increasing fatigue or weakness.  · You develop swelling of the hands, feet, legs, or face.  · You vomit bright red blood, or a coffee ground appearing material.  · You have blood in your stools, or the stools turn black and tarry.  · You have a fever.  · You develop loss of appetite, or have nausea and vomiting.  · You develop jaundice.  · You develop easy bruising or bleeding.  · You have worsening of any of the problems you are concerned about.  Document Released: 03/30/2005 Document Revised: 06/22/2011 Document Reviewed: 11/16/2007  ExitCare® Patient Information ©2015 [employer]. This information is not intended to replace advice given to you by your health care provider. Make sure you discuss any questions you have with your health care provider.

## 2014-10-22 LAB — DRUG SCREEN (9) + CREATININE, UR
Amphetamine Screen, Urine: NEGATIVE ng/mL
BENZODIAZ UR QL: NEGATIVE ng/mL
Barbiturate Quant, Ur: NEGATIVE ng/mL
COCAINE (METAB.), URINE: NEGATIVE ng/mL
Creatinine, Urine: 109.6 mg/dL (ref 20.0–300.0)
Methadone Screen, Urine: NEGATIVE ng/mL
PCP, Urine: NEGATIVE ng/mL
PROPOXYPHENE: NEGATIVE ng/mL
pH, Urine: 7 (ref 4.5–8.9)

## 2014-10-22 LAB — OPIATES CONFIRMATION, URINE
Codeine: NEGATIVE
HYDROCODONE: NEGATIVE
Hydromorphone: NEGATIVE
Morphine, Confirm: 687 ng/mL
Morphine: POSITIVE — AB
OPIATES: POSITIVE — AB

## 2014-10-22 LAB — OXYCODONE/OXYMORPHONE (GC/MS)
OXYCODONE+OXYMORPHONE UR QL SCN: POSITIVE — AB
OXYMORPHONE (GC/MS): 971 ng/mL
OXYMORPHONE UR QL CFM: POSITIVE — AB
Oxycodone (GC/MS): 595 ng/mL
Oxycodone, Conf, Urine: POSITIVE — AB

## 2014-10-23 ENCOUNTER — Telehealth: Payer: Self-pay | Admitting: Family Medicine

## 2014-10-23 NOTE — Telephone Encounter (Signed)
Kyle Gomez (wife) is requesting a letter saying that she is the primary caretaker of Kyle Gomez. She is not able to work due to having to take him to all of his appointments. They are trying to get assistance with food nutriention and this letter is required.

## 2014-10-23 NOTE — Telephone Encounter (Signed)
Patient was informed that letter was ready for pick up.

## 2014-10-23 NOTE — Telephone Encounter (Signed)
I have already drafted this letter 06/2014 in [employer], printed out and resigned and dated with today's date for patient or wife to pick up.

## 2014-11-05 ENCOUNTER — Other Ambulatory Visit: Payer: Self-pay | Admitting: Family Medicine

## 2014-11-05 DIAGNOSIS — G894 Chronic pain syndrome: Secondary | ICD-10-CM

## 2014-11-05 DIAGNOSIS — K7460 Unspecified cirrhosis of liver: Secondary | ICD-10-CM

## 2014-11-05 DIAGNOSIS — B182 Chronic viral hepatitis C: Secondary | ICD-10-CM

## 2014-11-05 NOTE — Telephone Encounter (Signed)
Pt is due for a refill this coming up weekend for Percocet. He has an appointment somewhere else on this Thursday and is requesting to pick it up then. Is this possible?

## 2014-11-05 NOTE — Telephone Encounter (Signed)
Refill request was sent to Dr. Ashany Sundaram for approval and submission.  

## 2014-11-06 ENCOUNTER — Telehealth: Payer: Self-pay

## 2014-11-06 MED ORDER — OXYCODONE HCL 15 MG PO TABS: 15.0000 mg | ORAL_TABLET | Freq: Four times a day (QID) | ORAL | Status: DC | PRN

## 2014-11-06 NOTE — Telephone Encounter (Signed)
Patient was informed.

## 2014-12-07 ENCOUNTER — Other Ambulatory Visit: Payer: Self-pay | Admitting: Family Medicine

## 2014-12-07 NOTE — Telephone Encounter (Signed)
Pt would like to pick up his oxycodone RX on Monday.

## 2014-12-07 NOTE — Telephone Encounter (Signed)
Patient called stating that he needed a refill of the above listed.  OxyCODONE HCl , 1 (one) Tablet every six hours, as needed, #124, 31 days starting 08/22/2014, No Refill. Active. Comments: May refill on or after 08/24/14  Recorded 08/22/2014 12:17 PM by Edwena Felty, MD, Annotation/Addendum. Printed to [employer], 08/22/2014 12:17PM  Refill request was sent to Dr. Edwena Felty for approval and submission.

## 2014-12-10 ENCOUNTER — Encounter: Payer: Self-pay | Admitting: *Deleted

## 2014-12-10 ENCOUNTER — Inpatient Hospital Stay
Admission: EM | Admit: 2014-12-10 | Discharge: 2014-12-13 | DRG: 885 | Disposition: A | Discharge status: Home / Self Care | Payer: Medicaid Other | Attending: Psychiatry | Admitting: Psychiatry

## 2014-12-10 ENCOUNTER — Emergency Department
Admission: EM | Admit: 2014-12-10 | Discharge: 2014-12-10 | Disposition: A | Discharge status: Other Acute Inpatient Hospital | Payer: Medicaid Other | Attending: Emergency Medicine | Admitting: Emergency Medicine

## 2014-12-10 DIAGNOSIS — F10230 Alcohol dependence with withdrawal, uncomplicated: Secondary | ICD-10-CM | POA: Insufficient documentation

## 2014-12-10 DIAGNOSIS — Z803 Family history of malignant neoplasm of breast: Secondary | ICD-10-CM | POA: Diagnosis not present

## 2014-12-10 DIAGNOSIS — F1120 Opioid dependence, uncomplicated: Secondary | ICD-10-CM | POA: Diagnosis present

## 2014-12-10 DIAGNOSIS — K219 Gastro-esophageal reflux disease without esophagitis: Secondary | ICD-10-CM | POA: Diagnosis present

## 2014-12-10 DIAGNOSIS — F410 Panic disorder [episodic paroxysmal anxiety] without agoraphobia: Secondary | ICD-10-CM | POA: Diagnosis present

## 2014-12-10 DIAGNOSIS — F10129 Alcohol abuse with intoxication, unspecified: Secondary | ICD-10-CM | POA: Diagnosis not present

## 2014-12-10 DIAGNOSIS — I10 Essential (primary) hypertension: Secondary | ICD-10-CM | POA: Diagnosis present

## 2014-12-10 DIAGNOSIS — Z720 Tobacco use: Secondary | ICD-10-CM | POA: Insufficient documentation

## 2014-12-10 DIAGNOSIS — F4310 Post-traumatic stress disorder, unspecified: Secondary | ICD-10-CM | POA: Diagnosis present

## 2014-12-10 DIAGNOSIS — N400 Enlarged prostate without lower urinary tract symptoms: Secondary | ICD-10-CM | POA: Diagnosis present

## 2014-12-10 DIAGNOSIS — Z79899 Other long term (current) drug therapy: Secondary | ICD-10-CM | POA: Insufficient documentation

## 2014-12-10 DIAGNOSIS — F1310 Sedative, hypnotic or anxiolytic abuse, uncomplicated: Secondary | ICD-10-CM | POA: Insufficient documentation

## 2014-12-10 DIAGNOSIS — F332 Major depressive disorder, recurrent severe without psychotic features: Secondary | ICD-10-CM | POA: Diagnosis present

## 2014-12-10 DIAGNOSIS — B182 Chronic viral hepatitis C: Secondary | ICD-10-CM | POA: Diagnosis present

## 2014-12-10 DIAGNOSIS — F10930 Alcohol use, unspecified with withdrawal, uncomplicated: Secondary | ICD-10-CM

## 2014-12-10 DIAGNOSIS — F1020 Alcohol dependence, uncomplicated: Secondary | ICD-10-CM | POA: Diagnosis present

## 2014-12-10 DIAGNOSIS — Z008 Encounter for other general examination: Secondary | ICD-10-CM | POA: Diagnosis present

## 2014-12-10 DIAGNOSIS — F1320 Sedative, hypnotic or anxiolytic dependence, uncomplicated: Secondary | ICD-10-CM | POA: Diagnosis present

## 2014-12-10 DIAGNOSIS — G894 Chronic pain syndrome: Secondary | ICD-10-CM | POA: Diagnosis present

## 2014-12-10 DIAGNOSIS — F17200 Nicotine dependence, unspecified, uncomplicated: Secondary | ICD-10-CM | POA: Diagnosis present

## 2014-12-10 DIAGNOSIS — Z7982 Long term (current) use of aspirin: Secondary | ICD-10-CM | POA: Insufficient documentation

## 2014-12-10 DIAGNOSIS — F17210 Nicotine dependence, cigarettes, uncomplicated: Secondary | ICD-10-CM | POA: Diagnosis present

## 2014-12-10 DIAGNOSIS — R6889 Other general symptoms and signs: Secondary | ICD-10-CM

## 2014-12-10 DIAGNOSIS — F1920 Other psychoactive substance dependence, uncomplicated: Secondary | ICD-10-CM

## 2014-12-10 HISTORY — DX: Essential (primary) hypertension: I10

## 2014-12-10 HISTORY — DX: Depression, unspecified: F32.A

## 2014-12-10 HISTORY — DX: Major depressive disorder, single episode, unspecified: F32.9

## 2014-12-10 HISTORY — DX: Anxiety disorder, unspecified: F41.9

## 2014-12-10 LAB — CBC
HEMATOCRIT: 51.9 % (ref 40.0–52.0)
HEMOGLOBIN: 18.1 g/dL — AB (ref 13.0–18.0)
MCH: 36.3 pg — ABNORMAL HIGH (ref 26.0–34.0)
MCHC: 34.8 g/dL (ref 32.0–36.0)
MCV: 104.2 fL — AB (ref 80.0–100.0)
PLATELETS: 74 10*3/uL — AB (ref 150–440)
RBC: 4.98 MIL/uL (ref 4.40–5.90)
RDW: 12.3 % (ref 11.5–14.5)
WBC: 9.3 10*3/uL (ref 3.8–10.6)

## 2014-12-10 LAB — COMPREHENSIVE METABOLIC PANEL
ALBUMIN: 4.2 g/dL (ref 3.5–5.0)
ALT: 46 U/L (ref 17–63)
ANION GAP: 12 (ref 5–15)
AST: 80 U/L — ABNORMAL HIGH (ref 15–41)
Alkaline Phosphatase: 81 U/L (ref 38–126)
BILIRUBIN TOTAL: 1.7 mg/dL — AB (ref 0.3–1.2)
BUN: 7 mg/dL (ref 6–20)
CO2: 23 mmol/L (ref 22–32)
Calcium: 9.1 mg/dL (ref 8.9–10.3)
Chloride: 102 mmol/L (ref 101–111)
Creatinine, Ser: 0.66 mg/dL (ref 0.61–1.24)
GFR calc Af Amer: >60 mL/min (ref >60)
GFR calc non Af Amer: >60 mL/min (ref >60)
Glucose, Bld: 116 mg/dL — ABNORMAL HIGH (ref 65–99)
Potassium: 3.8 mmol/L (ref 3.5–5.1)
Sodium: 137 mmol/L (ref 135–145)
TOTAL PROTEIN: 9 g/dL — AB (ref 6.5–8.1)

## 2014-12-10 LAB — URINE DRUG SCREEN, QUALITATIVE (ARMC ONLY)
AMPHETAMINES, UR SCREEN: NOT DETECTED
Barbiturates, Ur Screen: NOT DETECTED
Benzodiazepine, Ur Scrn: POSITIVE — AB
COCAINE METABOLITE, UR ~~LOC~~: NOT DETECTED
Cannabinoid 50 Ng, Ur ~~LOC~~: NOT DETECTED
MDMA (ECSTASY) UR SCREEN: NOT DETECTED
Methadone Scn, Ur: NOT DETECTED
Opiate, Ur Screen: NOT DETECTED
PHENCYCLIDINE (PCP) UR S: NOT DETECTED
Tricyclic, Ur Screen: NOT DETECTED

## 2014-12-10 LAB — ETHANOL: Alcohol, Ethyl (B): <5 mg/dL (ref <5)

## 2014-12-10 MED ORDER — VITAMIN B-1 100 MG PO TABS
100.0000 mg | ORAL_TABLET | Freq: Every day | ORAL | Status: DC
Start: 2014-12-10 — End: 2014-12-13
  Administered 2014-12-10 – 2014-12-13 (×3): 100 mg via ORAL
  Filled 2014-12-10 (×3): qty 1

## 2014-12-10 MED ORDER — LORAZEPAM 2 MG/ML IJ SOLN
0.0000 mg | Freq: Four times a day (QID) | INTRAMUSCULAR | Status: DC
  Filled 2014-12-10: qty 1

## 2014-12-10 MED ORDER — LORAZEPAM 2 MG/ML IJ SOLN
0.0000 mg | Freq: Two times a day (BID) | INTRAMUSCULAR | Status: DC
  Administered 2014-12-10 (×2): 2 mg via INTRAVENOUS

## 2014-12-10 MED ORDER — LORAZEPAM 2 MG PO TABS: 0.0000 mg | ORAL_TABLET | Freq: Two times a day (BID) | ORAL | Status: DC

## 2014-12-10 MED ORDER — VITAMIN B-1 100 MG PO TABS: 100.0000 mg | ORAL_TABLET | Freq: Every day | ORAL | Status: DC

## 2014-12-10 MED ORDER — ADULT MULTIVITAMIN W/MINERALS CH
1.0000 | ORAL_TABLET | Freq: Every day | ORAL | Status: DC
  Administered 2014-12-10 – 2014-12-13 (×3): 1 via ORAL
  Filled 2014-12-10 (×3): qty 1

## 2014-12-10 MED ORDER — LORAZEPAM 2 MG PO TABS: 0.0000 mg | ORAL_TABLET | Freq: Four times a day (QID) | ORAL | Status: DC

## 2014-12-10 MED ORDER — THIAMINE HCL 100 MG/ML IJ SOLN
Freq: Once | INTRAVENOUS | Status: AC
  Administered 2014-12-10: 03:00:00 via INTRAVENOUS
  Filled 2014-12-10: qty 1000

## 2014-12-10 MED ORDER — ONDANSETRON HCL 4 MG/2ML IJ SOLN
4.0000 mg | Freq: Once | INTRAMUSCULAR | Status: AC
  Administered 2014-12-10: 4 mg via INTRAVENOUS

## 2014-12-10 MED ORDER — CLONIDINE HCL 0.1 MG PO TABS
0.1000 mg | ORAL_TABLET | Freq: Once | ORAL | Status: AC
  Administered 2014-12-10: 0.1 mg via ORAL

## 2014-12-10 MED ORDER — FOLIC ACID 1 MG PO TABS
1.0000 mg | ORAL_TABLET | Freq: Every day | ORAL | Status: DC
  Administered 2014-12-10 – 2014-12-13 (×3): 1 mg via ORAL
  Filled 2014-12-10 (×3): qty 1

## 2014-12-10 MED ORDER — LORAZEPAM 1 MG PO TABS
1.0000 mg | ORAL_TABLET | Freq: Four times a day (QID) | ORAL | Status: DC | PRN
  Administered 2014-12-10 – 2014-12-11 (×3): 1 mg via ORAL
  Filled 2014-12-10 (×3): qty 1

## 2014-12-10 MED ORDER — CHLORDIAZEPOXIDE HCL 25 MG PO CAPS
50.0000 mg | ORAL_CAPSULE | ORAL | Status: AC
  Administered 2014-12-10: 50 mg via ORAL
  Filled 2014-12-10: qty 2

## 2014-12-10 MED ORDER — ONDANSETRON HCL 4 MG/2ML IJ SOLN
INTRAMUSCULAR | Status: AC
  Filled 2014-12-10: qty 2

## 2014-12-10 MED ORDER — THIAMINE HCL 100 MG/ML IJ SOLN: 100.0000 mg | Freq: Every day | INTRAMUSCULAR | Status: DC

## 2014-12-10 MED ORDER — LORAZEPAM 2 MG/ML IJ SOLN: 1.0000 mg | Freq: Four times a day (QID) | INTRAMUSCULAR | Status: DC | PRN

## 2014-12-10 MED ORDER — LOPERAMIDE HCL 2 MG PO CAPS
2.0000 mg | ORAL_CAPSULE | ORAL | Status: DC | PRN
  Administered 2014-12-10 – 2014-12-11 (×3): 2 mg via ORAL
  Filled 2014-12-10 (×4): qty 1

## 2014-12-10 MED ORDER — NADOLOL 40 MG PO TABS
40.0000 mg | ORAL_TABLET | Freq: Every day | ORAL | Status: DC
  Administered 2014-12-10: 40 mg via ORAL
  Filled 2014-12-10: qty 1

## 2014-12-10 NOTE — ED Notes (Signed)
Patient continues to ask for Ativan. CIWA =3, no s/s ETOH withdrawal or opiate withdrawal. Patient was educated that medication was given per symptoms. Patient is quiet, resting in bed. Able to engage in conversation with nurse.  BEHAVIORAL HEALTH ROUNDING Patient sleeping: No Patient alert and oriented: YES Behavior appropriate: YES Describe behavior: No inappropriate or unacceptable behaviors noted at this time.  Nutrition and fluids offered: YES Toileting and hygiene offered: YES Sitter present: Interior and spatial designer rounding every 15 minutes on patient to ensure safety.  Law enforcement present: Loss adjuster, chartered agency: [employer], television/film set ([employer])

## 2014-12-10 NOTE — ED Notes (Signed)
EMS pt to triage via wheelchair. Pt reports he wants help with detox from percocet and alcohol. Pt reports he has not drank or used percocet in over 1.5 days now and is going through withdrawals. Pt is jittery, nauseated and hypertensive during triage.

## 2014-12-10 NOTE — [facility] Counselor (Signed)
Pt. is to be admitted to [facility] by Dr. Garnetta Buddy. Attending Physician will be Dr. Jennet Maduro.  Pt. has been assigned to room 312, by [facility] Charge Nurse Dedra Skeens, RN.  Intake Paper Work has been signed and placed on pt. chart. ER staff ( Dr. Scotty Court, ER MD; Wylie Hail, RN Patient's Nurse & Patient Access) have been made aware of the admission.

## 2014-12-10 NOTE — ED Notes (Signed)
Urine collected and sent.

## 2014-12-10 NOTE — ED Notes (Signed)
Patient given telephone to call wife.

## 2014-12-10 NOTE — ED Provider Notes (Signed)
-----------------------------------------   11:20 AM on 12/10/2014 -----------------------------------------   Blood pressure 176/92, pulse 70, temperature 98.3 F (36.8 C), temperature source Oral, resp. rate 22, height  (1.854 m), weight 215 lb (97.523 kg), SpO2 97 %.  The patient had no acute events since last update.  Calm and cooperative at this time.  Remains medically stable. Discussed with psychiatry in the ED to plan is to admit the patient for supervised detox.   Sharman Cheek, MD 12/10/14 1120

## 2014-12-10 NOTE — Tx Team (Signed)
Initial Interdisciplinary Treatment Plan   PATIENT STRESSORS: Health problems Substance abuse   PATIENT STRENGTHS: Ability for insight Communication skills   PROBLEM LIST: Problem List/Patient Goals Date to be addressed Date deferred Reason deferred Estimated date of resolution  depression      Substance abuse                                                 DISCHARGE CRITERIA:  Improved stabilization in mood, thinking, and/or behavior Medical problems require only outpatient monitoring  PRELIMINARY DISCHARGE PLAN: Outpatient therapy Return to previous living arrangement  PATIENT/FAMIILY INVOLVEMENT: This treatment plan has been presented to and reviewed with the patient, Kyle Gomez.  The patient and family have been given the opportunity to ask questions and make suggestions.  Kenlyn Lose, Sarajane Marek 12/10/2014, 7:37 PM
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
## 2014-12-10 NOTE — ED Notes (Signed)
Pt resting, food at bedside, states he will try to eat it as soon as the nausea medication begins to work. Requesting to use the phone to call his wife, informed pt that he can use the phone at 0900.

## 2014-12-10 NOTE — ED Notes (Signed)
Wife at bedside.

## 2014-12-10 NOTE — ED Notes (Signed)

## 2014-12-10 NOTE — ED Notes (Signed)
Pt changed out by this RN due to placement within the quad, one bag of belongings

## 2014-12-10 NOTE — ED Notes (Signed)

## 2014-12-10 NOTE — ED Notes (Signed)
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
## 2014-12-11 ENCOUNTER — Encounter: Payer: Self-pay | Admitting: Psychiatry

## 2014-12-11 DIAGNOSIS — F1120 Opioid dependence, uncomplicated: Secondary | ICD-10-CM | POA: Diagnosis present

## 2014-12-11 DIAGNOSIS — F1320 Sedative, hypnotic or anxiolytic dependence, uncomplicated: Secondary | ICD-10-CM | POA: Diagnosis present

## 2014-12-11 DIAGNOSIS — F17200 Nicotine dependence, unspecified, uncomplicated: Secondary | ICD-10-CM | POA: Diagnosis present

## 2014-12-11 DIAGNOSIS — F1020 Alcohol dependence, uncomplicated: Secondary | ICD-10-CM | POA: Diagnosis present

## 2014-12-11 DIAGNOSIS — F332 Major depressive disorder, recurrent severe without psychotic features: Principal | ICD-10-CM

## 2014-12-11 DIAGNOSIS — F1920 Other psychoactive substance dependence, uncomplicated: Secondary | ICD-10-CM

## 2014-12-11 MED ORDER — ONDANSETRON HCL 4 MG PO TABS: 4.0000 mg | ORAL_TABLET | Freq: Three times a day (TID) | ORAL | Status: DC | PRN

## 2014-12-11 MED ORDER — CHLORDIAZEPOXIDE HCL 25 MG PO CAPS
50.0000 mg | ORAL_CAPSULE | Freq: Once | ORAL | Status: DC | PRN
  Administered 2014-12-11 – 2014-12-12 (×3): 50 mg via ORAL
  Filled 2014-12-11 (×3): qty 2

## 2014-12-11 MED ORDER — TAMSULOSIN HCL 0.4 MG PO CAPS
0.4000 mg | ORAL_CAPSULE | Freq: Every day | ORAL | Status: DC
  Administered 2014-12-11 – 2014-12-13 (×4): 0.4 mg via ORAL
  Filled 2014-12-11 (×4): qty 1

## 2014-12-11 MED ORDER — CHLORDIAZEPOXIDE HCL 25 MG PO CAPS
25.0000 mg | ORAL_CAPSULE | Freq: Four times a day (QID) | ORAL | Status: DC
  Administered 2014-12-11 – 2014-12-13 (×8): 25 mg via ORAL
  Filled 2014-12-11 (×9): qty 1

## 2014-12-11 MED ORDER — PANTOPRAZOLE SODIUM 40 MG PO TBEC
40.0000 mg | DELAYED_RELEASE_TABLET | Freq: Every day | ORAL | Status: DC
  Administered 2014-12-11 – 2014-12-13 (×3): 40 mg via ORAL
  Filled 2014-12-11 (×3): qty 1

## 2014-12-11 MED ORDER — NADOLOL 40 MG PO TABS
40.0000 mg | ORAL_TABLET | Freq: Every day | ORAL | Status: DC
  Administered 2014-12-11 – 2014-12-13 (×3): 40 mg via ORAL
  Filled 2014-12-11 (×4): qty 1

## 2014-12-11 MED ORDER — IBUPROFEN 600 MG PO TABS
600.0000 mg | ORAL_TABLET | Freq: Four times a day (QID) | ORAL | Status: DC | PRN
  Administered 2014-12-12: 600 mg via ORAL
  Filled 2014-12-11: qty 1

## 2014-12-11 MED ORDER — CLONIDINE HCL 0.1 MG PO TABS
0.1000 mg | ORAL_TABLET | Freq: Once | ORAL | Status: AC
Start: 2014-12-11 — End: 2014-12-11
  Administered 2014-12-11: 0.1 mg via ORAL

## 2014-12-11 NOTE — Plan of Care (Signed)
Problem: Alteration in mood & ability to function due to Goal: LTG-Pt verbalizes understanding of importance of med regimen (Patient verbalizes understanding of importance of medication regimen and need to continue outpatient care and support groups)  Outcome: Progressing Cooperative with meds. Wants to speak with MD rt which meds he is on and which meds he would like to be on.

## 2014-12-11 NOTE — Progress Notes (Signed)
Recreation Therapy Notes  At approximately 11:05 am, LRT attempted assessment. Patient sleeping. LRT will attempt assessment at a later time.   Jacquelynn Cree, LRT/CTRS 12/11/2014 11:56 AM

## 2014-12-11 NOTE — Plan of Care (Signed)
Problem: Alteration in mood & ability to function due to Goal: LTG-Patient demonstrates decreased signs of withdrawal (Patient demonstrates decreased signs of withdrawal to the point the patient is safe to return home and continue treatment in an outpatient setting)  Outcome: Progressing Patient responding well to medications. Fell asleep after medication administation

## 2014-12-11 NOTE — Progress Notes (Signed)
Patient has been anxious, restless and frequently requesting medications. Complaining of diarrhea : medications given as prescribed. Has been restless, unable to sleep. Medications given as prescribed, support offered  And safety maintained on the unit.

## 2014-12-11 NOTE — Progress Notes (Addendum)
Patient BP: 174/100, checked manually. Pulse 55. MD was notified. Had Clonidine 0.1 PO once. Currently in bed resting. Safety precautions reinforced.

## 2014-12-11 NOTE — Progress Notes (Addendum)
Patient refused Librium 25 mg as scheduled, loudly yell and smacked the table. I want the 50 mg of Librium PRN, behavior deescalated by given him the 50 mg

## 2014-12-11 NOTE — Progress Notes (Signed)
Initial Nutrition Assessment   INTERVENTION:   Meals and Snacks: Cater to patient preferences Medical Food Supplement Therapy: will recommend on follow if intake poor.   NUTRITION DIAGNOSIS:   Predicted suboptimal nutrient intake related to social / environmental circumstances as evidenced by per patient/family report.  GOAL:   Patient will meet greater than or equal to 90% of their needs  MONITOR:    (Energy Intake, Electrolyte and renal Profile, Anthropometrics)  REASON FOR ASSESSMENT:   Malnutrition Screening Tool    ASSESSMENT:   Pt admitted wanting detox from alcohol and percocet per MD note. Pt was consuming atleast 2 cases of beer and 10 percocet per day per MD note.  Past Medical History  Diagnosis Date  . Alcoholism   . GERD (gastroesophageal reflux disease)   . Thrombocytopenia   . Thyroid disease   . Chronic hepatitis C without hepatic coma   . Polysubstance abuse   . Cholelithiasis without cholecystitis   . Prostate disease   . Chronic pain disorder   . Rectal bleeding   . Eczema   . Hemorrhoid prolapse   . Macrocytosis   . Hypertension   . Depression   . Anxiety     Diet Order:  Diet regular Room service appropriate?: Yes; Fluid consistency:: Thin    Current Nutrition: Pt ate 50% of lunch, no breakfast and 100% of dinner last night recorded.   Food/Nutrition-Related History: Per [facility] decrease in appetite PTA. RD notes EtOH use PTA.   Medications: folic acid, MVI, thiamine, ativan, immodium  Electrolyte/Renal Profile and Glucose Profile:   Recent Labs Lab 12/10/14 0123  NA 137  K 3.8  CL 102  CO2 23  BUN 7  CREATININE 0.66  CALCIUM 9.1  GLUCOSE 116*   Protein Profile:  Recent Labs Lab 12/10/14 0123  ALBUMIN 4.2    Gastrointestinal Profile: Last BM: unknown   Weight Change: Per [facility] pt with 4% weight loss in 2 months  Height:   Ht Readings from Last 1 Encounters:  12/10/14  (1.854 m)    Weight:   Wt Readings  from Last 1 Encounters:  12/10/14 206 lb (93.441 kg)   Wt Readings from Last 10 Encounters:  12/10/14 206 lb (93.441 kg)  12/10/14 215 lb (97.523 kg)  10/12/14 215 lb (97.523 kg)    BMI:  Body mass index is 27.18 kg/(m^2).   LOW Care Level  Leda Quail, [place], [place] Pager [phone]

## 2014-12-11 NOTE — Plan of Care (Signed)
Problem: Alteration in mood & ability to function due to Goal: STG-Pt will be introduced to the 12-step program of recovery (Patient will be introduced to the 12-step program of recovery and disease concept of addiction)  Outcome: Progressing Patient motivated for outpatient program " I am tired of feeling like this, I have to do something"

## 2014-12-11 NOTE — [facility] Suicide Risk Assessment (Signed)
[facility] Admission Suicide Risk Assessment   Nursing information obtained from:    Demographic factors:    Current Mental Status:    Loss Factors:    Historical Factors:    Risk Reduction Factors:    Total Time spent with patient: 1 hour Principal Problem: Major depressive disorder, recurrent severe without psychotic features Diagnosis:   Patient Active Problem List   Diagnosis Date Noted  . Alcohol use disorder, severe, dependence [F10.20] 12/11/2014  . Opioid use disorder, severe, dependence [F11.20] 12/11/2014  . 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 Tobacco use disorder [Z72.0] 12/11/2014  . Alcohol abuse with intoxication [F10.129] 12/10/2014  . Major depressive disorder, recurrent severe without psychotic features [F33.2]   . Benign prostatic hypertrophy without lower urinary tract symptoms [N40.0] 10/11/2014  . Polysubstance dependence [F19.20] 10/11/2014  . Chronic hepatitis C without hepatic coma [B18.2] 10/11/2014  . Hyperbilirubinemia [E80.6] 10/11/2014  . Cholelithiasis without cholecystitis [K80.20] 10/11/2014  . Hypothyroidism, adult [E03.9] 10/11/2014  . Thrombocytopenia [D69.6] 10/11/2014  . Bleeding hemorrhoids [K64.9] 10/11/2014  . Chronic pain syndrome [G89.4] 09/18/2014  . Chronic viral hepatitis C [B18.2] 01/08/2014  . Cirrhosis [K74.60] 09/20/2013  . Abnormal liver enzymes [R74.8] 09/20/2013     Continued Clinical Symptoms:  Alcohol Use Disorder Identification Test Final Score (AUDIT): 28 The "Alcohol Use Disorders Identification Test", Guidelines for Use in Primary Care, Second Edition.  World Science writer [facility]). Score between 0-7:  no or low risk or alcohol related problems. Score between 8-15:  moderate risk of alcohol related problems. Score between 16-19:  high risk of alcohol related problems. Score 20 or above:  warrants further diagnostic evaluation for alcohol dependence and  treatment.   CLINICAL FACTORS:   Depression:   Comorbid alcohol abuse/dependence Severe Alcohol/Substance Abuse/Dependencies   Musculoskeletal: Strength & Muscle Tone: within normal limits Gait & Station: normal Patient leans: N/A  Psychiatric Specialty Exam: Physical Exam  Nursing note and vitals reviewed.   Review of Systems  Gastrointestinal: Positive for nausea.  All other systems reviewed and are negative.   Blood pressure 169/105, pulse 65, temperature 97.9 F (36.6 C), temperature source Oral, resp. rate 20, height 6\' 1"  (1.854 m), weight 93.441 kg (206 lb).Body mass index is 27.18 kg/(m^2).  General Appearance: Casual  Eye Contact::  Fair  Speech:  Clear and Coherent  Volume:  Normal  Mood:  Depressed  Affect:  Depressed  Thought Process:  Goal Directed  Orientation:  Full (Time, Place, and Person)  Thought Content:  WDL  Suicidal Thoughts:  No  Homicidal Thoughts:  No  Memory:  Immediate;   Fair Recent;   Fair Remote;   Fair  Judgement:  Impaired  Insight:  Shallow  Psychomotor Activity:  Normal  Concentration:  Fair  Recall:  [employer] of Knowledge:Fair  Language: Fair  Akathisia:  No  Handed:  Right  AIMS (if indicated):     Assets:  Communication Skills Desire for Improvement  Sleep:  Number of Hours: 5.3  Cognition: WNL  ADL's:  Intact     COGNITIVE FEATURES THAT CONTRIBUTE TO RISK:  None    SUICIDE RISK:   Mild:  Suicidal ideation of limited frequency, intensity, duration, and specificity.  There are no identifiable plans, no associated intent, mild dysphoria and related symptoms, good self-control (both objective and subjective assessment), few other risk factors, and identifiable protective factors, including available and accessible social support.  PLAN OF CARE: Hospital admission, medication management, alcohol detox,  substance abuse counseling, discharge planning.  Medical Decision Making:  New problem, with additional work up  planned, Review of Psycho-Social Stressors (1), Review or order clinical lab tests (1), Review of Medication Regimen & Side Effects (2) and Review of New Medication or Change in Dosage (2)   Kyle Gomez is a 54 year old male with history of depression and substance use.  1. Mood. The patient is not interested in starting an antidepressant.  2. Alcohol detox. He is on Librium taper.  3. Opiate withdrawal. This will be treated symptomatically.  4. Hypertension. We'll continue to follow.  5. BPH. We'll continue Flomax.  6. Substance abuse treatment. The patient not interested at this point.  7. Disposition. To be established. Most likely home with his wife.    I certify that inpatient services furnished can reasonably be expected to improve the patient's condition.   Kyle Gomez 12/11/2014, 4:10 PM

## 2014-12-11 NOTE — Progress Notes (Signed)
Recreation Therapy Notes  Date: 08.30.16 Time: 3:00 pm Location: Craft Room  Group Topic: Coping Skills  Goal Area(s) Addresses:  Patients will verbalize emotions related to their recovery. Patients will write down healthy coping skills.  Behavioral Response: Did not attend  Intervention: Emotion Wheel  Activity: Patients were given a worksheet with 8 sections and instructed to come up as a group with 8 emotions related to recovery. Patients were instructed to list healthy coping skills.  Education: LRT educated patients on healthy coping skills   Education Outcome: Patient did not attend group.   Clinical Observations/Feedback: Patient did not attend group.  Jacquelynn Cree, LRT/CTRS 12/11/2014 4:16 PM

## 2014-12-11 NOTE — [facility] Group Notes (Signed)
[facility] Group Notes:  (Nursing/MHT/Case Management/Adjunct)  Date:  12/11/2014  Time:  2:32 PM  Type of Therapy:  Psychoeducational Skills  Participation Level:  Did Not Attend  Lynelle Smoke [facility] 12/11/2014, 2:32 PM
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
## 2014-12-12 ENCOUNTER — Other Ambulatory Visit: Payer: Self-pay | Admitting: Family Medicine

## 2014-12-12 DIAGNOSIS — G894 Chronic pain syndrome: Secondary | ICD-10-CM

## 2014-12-12 DIAGNOSIS — K7460 Unspecified cirrhosis of liver: Secondary | ICD-10-CM

## 2014-12-12 DIAGNOSIS — B182 Chronic viral hepatitis C: Secondary | ICD-10-CM

## 2014-12-12 NOTE — Progress Notes (Signed)
Patient slept for 7.45 hours 

## 2014-12-12 NOTE — Plan of Care (Signed)
Problem: Alteration in mood & ability to function due to Goal: LTG-Pt reports reduction in suicidal thoughts (Patient reports reduction in suicidal thoughts and is able to verbalize a safety plan for whenever patient is feeling suicidal)  Outcome: Progressing Denies SI     

## 2014-12-12 NOTE — [facility] Group Notes (Signed)
[facility] LCSW Aftercare Discharge Planning Group Note   12/12/2014 3:23 PM  Participation Quality: did not attend group    Lulu Riding, MSW, [employer]

## 2014-12-12 NOTE — Telephone Encounter (Signed)
Pt would like a refill on oxycodone to be picked up tomorrow 12/13/14.

## 2014-12-12 NOTE — [facility] Group Notes (Signed)
[facility] Group Notes:  (Nursing/MHT/Case Management/Adjunct)  Date:  12/12/2014  Time:  1:49 PM  Type of Therapy:  Psychoeducational Skills  Participation Level:  Minimal  Participation Quality:  Intrusive and came in late   Affect:  Blunted  Cognitive:  Oriented  Insight:  Improving  Engagement in Group:  Off Topic  Modes of Intervention:  Support  Summary of Progress/Problems:  Kyle Gomez 12/12/2014, 1:49 PM

## 2014-12-12 NOTE — Plan of Care (Signed)
Problem: Alteration in mood & ability to function due to Goal: STG-Patient will report withdrawal symptoms Outcome: Completed/Met Date Met:  12/12/14 Pt is able to report withdrawal symptoms.

## 2014-12-12 NOTE — Telephone Encounter (Signed)
Refill request was sent to Dr. Ashany Sundaram for approval and submission.  

## 2014-12-12 NOTE — Telephone Encounter (Signed)
Please convey to Kyle Gomez that I have been reviewing documents sent to me from Kyle Gomez recent hospitalization. Kyle Gomez pain contract and management with me was contingent on Kyle Gomez sobriety from alcohol and avoiding illicit drugs like heroine.  I will no longer be able to manage Kyle Gomez pain and prescribe controled substances for Kyle Gomez pain as he has broken Kyle Gomez pain contract.  If he would like I can try and see if a chronic pain specialist would be willing to take on Kyle Gomez care but that is all I can offer at this time.

## 2014-12-12 NOTE — Plan of Care (Signed)
Problem: Alteration in mood & ability to function due to Goal: LTG-Patient demonstrates decreased signs of withdrawal (Patient demonstrates decreased signs of withdrawal to the point the patient is safe to return home and continue treatment in an outpatient setting)  Outcome: Not Progressing Patient frequently requests librium for withdrawal symptoms although not signs of withdrawal noted.
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
## 2014-12-12 NOTE — Progress Notes (Signed)
  [facility] Adult Case Management Discharge Plan :  Will you be returning to the same living situation after discharge:  Yes,    At discharge, do you have transportation home?: Yes,    Do you have the ability to pay for your medications: Yes,     Release of information consent forms completed and in the chart;  Patient's signature needed at discharge.  Patient to Follow up at: Follow-up Information    Go to [facility].   Why:  Walk-ins Monday, Wednesday, Friday between 8am-3pm, Hospital Follow up, Outpatient Medication Management, Therapy Referral for [facility]   Contact information:   [address] [place] [address] [phone] Fax-[phone]      Follow up with [facility].   Contact information:   Unable to reach [facility] to hear if able to return, please follow up with them at discharge if you would like to participate in their program.  Phone: [phone]      Patient denies SI/HI: Yes,       Safety Planning and Suicide Prevention discussed: Yes,     Have you used any form of tobacco in the last 30 days? (Cigarettes, Smokeless Tobacco, Cigars, and/or Pipes): Yes  Has patient been referred to the Quitline?: Patient refused referral   Rodrigo Mcgranahan, Cleda Daub, MSW, LCSW 12/12/2014, 4:31 PM

## 2014-12-12 NOTE — Progress Notes (Signed)
Assessment completed, pt is AOx3, denies any SI/HI/VAH; Pt in a flat affect and somewhat irritable with complaints of being treated unfairly earlier today by a staff. Pt said was denies the request for OJ on the grounds that it wasn't snack time and meanwhile, another patient was given a double portion of dinner and another was given peanut butter just at the same time. Pt said he was able to use his coping skills to avoid being very upset about that. Said he's leaving tomorrow and didn't want to do anything to jeopardize that. No further concerns voice, pt agrees to discuss his feelings with staff, will continue to monitor for safety.

## 2014-12-12 NOTE — Plan of Care (Signed)
Problem: Alteration in mood & ability to function due to Goal: LTG-Patient demonstrates decreased signs of withdrawal (Patient demonstrates decreased signs of withdrawal to the point the patient is safe to return home and continue treatment in an outpatient setting)  Outcome: Progressing A&Ox3, denied AV/H, denied nausea, no visible anxiety or agitation, denied HA; patient scored 2 on CIWA; will continue to monitor for withdrawal S&S.  Problem: Ineffective individual coping Goal: STG: Patient will remain free from self harm Outcome: Progressing Patient has remained free of harm & injury, will continue to monitor every 15 minutes for safety.

## 2014-12-12 NOTE — Progress Notes (Signed)
Recreation Therapy Notes  Date: 08.31.16 Time: 3:00 pm Location: Craft Room  Group Topic: Self-esteem  Goal Area(s) Addresses:  Patients will write down at least one positive trait about self. Patients will verbalize importance of having a good self-esteem.  Behavioral Response: Attentive, Interactive  Intervention: I Am  Activity: Patients were given a worksheet with the letter I on it and instructed to fill the letter with as many positive traits as they could.   Education: LRT educated patients on ways to increase their self-esteem.   Education Outcome: Acknowledges education/In group clarification offered   Clinical Observations/Feedback: Patient completed worksheet. Patient contributed to group discussion by stating it was easy to think of positive traits and why, and how he can improve his self-esteem. Patient talked about going to jail and his belongings a lot. LRT attempted to redirect patient.  Jacquelynn Cree, LRT/CTRS 12/12/2014 4:38 PM

## 2014-12-12 NOTE — [facility] Suicide Risk Assessment (Signed)
[facility] INPATIENT:  Family/Significant Other Suicide Prevention Education  Suicide Prevention Education:  Patient Refusal for Family/Significant Other Suicide Prevention Education: The patient Kyle Gomez has refused to provide written consent for family/significant other to be provided Family/Significant Other Suicide Prevention Education during admission and/or prior to discharge.  Physician notified.  Glennon Mac, MSW LCSW 12/12/2014, 4:31 PM

## 2014-12-12 NOTE — Progress Notes (Signed)
Up and came to the nurses' station, " my head hurts and I feel like Sh*t ..." Ibuprofen 600 mg and Librium 50 mg PRNs given at 0417, patient returned back to his room.

## 2014-12-12 NOTE — Progress Notes (Signed)
Looks tense.  Denies SI.  At morning med pass requesting to have librium 50 mg. Explained that he was getting his standing dose of 25 mg.  Patient states "I had that same argument with the nurse from last night"  Explained the difference between prn and standing.  Also explained that according to the way the order is written for the prn librium, I would need to get clarification from the doctor because according to the order he was to only receive the 50 mg of librium once. Verbalized understanding.  Visible in milieu, minimal interaction noted with peers.

## 2014-12-13 ENCOUNTER — Telehealth: Payer: Self-pay

## 2014-12-13 ENCOUNTER — Telehealth: Payer: Self-pay | Admitting: Family Medicine

## 2014-12-13 MED ORDER — CHLORDIAZEPOXIDE HCL 25 MG PO CAPS
50.0000 mg | ORAL_CAPSULE | Freq: Once | ORAL | Status: AC
  Administered 2014-12-13: 50 mg via ORAL
  Filled 2014-12-13: qty 2

## 2014-12-13 NOTE — [facility] Suicide Risk Assessment (Signed)
[facility] Discharge Suicide Risk Assessment   Demographic Factors:  Male and Caucasian  Total Time spent with patient: 30 minutes  Musculoskeletal: Strength & Muscle Tone: within normal limits Gait & Station: normal Patient leans: N/A  Psychiatric Specialty Exam: Physical Exam  Nursing note and vitals reviewed.   Review of Systems  All other systems reviewed and are negative.   Blood pressure 162/92, pulse 77, temperature 98.2 F (36.8 C), temperature source Oral, resp. rate 20, height 6\' 1"  (1.854 m), weight 93.441 kg (206 lb).Body mass index is 27.18 kg/(m^2).  General Appearance: Casual  Eye Contact::  Good  Speech:  Normal Rate409  Volume:  Normal  Mood:  Anxious  Affect:  Appropriate  Thought Process:  Goal Directed  Orientation:  Full (Time, Place, and Person)  Thought Content:  WDL  Suicidal Thoughts:  No  Homicidal Thoughts:  No  Memory:  Immediate;   Fair Recent;   Fair Remote;   Fair  Judgement:  Impaired  Insight:  Shallow  Psychomotor Activity:  Normal  Concentration:  Fair  Recall:  [employer] of Knowledge:Fair  Language: Fair  Akathisia:  No  Handed:  Right  AIMS (if indicated):     Assets:  Communication Skills Desire for Improvement Financial Resources/Insurance Housing Intimacy Social Support  Sleep:  Number of Hours: 7.45  Cognition: WNL  ADL's:  Intact   Have you used any form of tobacco in the last 30 days? (Cigarettes, Smokeless Tobacco, Cigars, and/or Pipes): Yes  Has this patient used any form of tobacco in the last 30 days? (Cigarettes, Smokeless Tobacco, Cigars, and/or Pipes) Yes, A prescription for an FDA-approved tobacco cessation medication was offered at discharge and the patient refused  Mental Status Per Nursing Assessment::   On Admission:     Current Mental Status by Physician: NA  Loss Factors: Decline in physical health and Legal issues  Historical Factors: Impulsivity  Risk Reduction Factors:   Sense of  responsibility to family, Living with another person, especially a relative and Positive social support  Continued Clinical Symptoms:  Alcohol/Substance Abuse/Dependencies  Cognitive Features That Contribute To Risk:  None    Suicide Risk:  Minimal: No identifiable suicidal ideation.  Patients presenting with no risk factors but with morbid ruminations; may be classified as minimal risk based on the severity of the depressive symptoms  Principal Problem: Major depressive disorder, recurrent severe without psychotic features Discharge Diagnoses:  Patient Active Problem List   Diagnosis Date Noted  . Alcohol use disorder, severe, dependence [F10.20] 12/11/2014  . Opioid use disorder, severe, dependence [F11.20] 12/11/2014  . 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 Tobacco use disorder [Z72.0] 12/11/2014  . Alcohol abuse with intoxication [F10.129] 12/10/2014  . Major depressive disorder, recurrent severe without psychotic features [F33.2]   . Benign prostatic hypertrophy without lower urinary tract symptoms [N40.0] 10/11/2014  . Polysubstance dependence [F19.20] 10/11/2014  . Chronic hepatitis C without hepatic coma [B18.2] 10/11/2014  . Hyperbilirubinemia [E80.6] 10/11/2014  . Cholelithiasis without cholecystitis [K80.20] 10/11/2014  . Hypothyroidism, adult [E03.9] 10/11/2014  . Thrombocytopenia [D69.6] 10/11/2014  . Bleeding hemorrhoids [K64.9] 10/11/2014  . Chronic pain syndrome [G89.4] 09/18/2014  . Chronic viral hepatitis C [B18.2] 01/08/2014  . Cirrhosis [K74.60] 09/20/2013  . Abnormal liver enzymes [R74.8] 09/20/2013    Follow-up Information    Go to [facility].   Why:  Walk-ins Monday, Wednesday, Friday between 8am-3pm, Hospital Follow up, Outpatient Medication Management, Therapy Referral for [facility]  Contact information:   [address] [place] [address] [phone] Fax-[phone]      Follow up with [facility].    Contact information:   Unable to reach [facility] to hear if able to return, please follow up with them at discharge if you would like to participate in their program.  Phone: [phone]      Plan Of Care/Follow-up recommendations:  Activity:  as tolerated Diet:  low sodium heart healthy Other:  keep follow up appointments  Is patient on multiple antipsychotic therapies at discharge:  No   Has Patient had three or more failed trials of antipsychotic monotherapy by history:  No  Recommended Plan for Multiple Antipsychotic Therapies: NA    Florette Thai 12/13/2014, 8:01 AM

## 2014-12-13 NOTE — Telephone Encounter (Signed)
This patient and his wife stated that he needed to get something because he had cirrhosis of the liver and cancer and he can't make it without having something. I relayed Dr. Debby Freiberg message and told him that there was nothing else we can do here in regards to his pain since her broke his pain contract. He then stated that he slipped up b/c he did not have any pain meds so he did some drinking for about 3 days but then he turned himself in. He said he did not have any heroine in his system and she could talk to the doctor at the hospital to verify that. He asked if he could talk to her for and I told him that Dr. Sherley Bounds was not in, but as soon as she comes in I will give her this message. His wife went on to say that if he does not get something for pain he will be back at the liquor store, but he stated that he wouldn't live long like this. I apologized to him and stated again that there was nothing that I could do but give her this message and see what she says. I promised him that either Dr. Sherley Bounds or myself will give him a call back before the end of the day.

## 2014-12-13 NOTE — Telephone Encounter (Signed)
Contacted this patient back and after consulting with Dr. Sherley Bounds, I restated that she will not fill his pain meds.   Dr. Carlynn Purl then briefly spokt with this patient and told him that he will not receive anymore pain meds and that if he kept calling back requesting them he will be dismissed from the clinic.  Orlinda Blalock was informed.

## 2014-12-13 NOTE — Progress Notes (Signed)
Patient discharged home with wife. Instructions reviewed. Patient verbalized understanding of meds and follow up care. Belongings returned. Denies SI/HI/AVH.

## 2014-12-13 NOTE — Telephone Encounter (Signed)
Pts wife Kyle Gomez called wanting someone to call her back to explain to Kyle Gomez why he is not a pt here any longer. She says they are both getting very frustrated and would like a explanation. Wife states the only thing that he has done was drank alcohol and hasn't done anything else.

## 2014-12-13 NOTE — Progress Notes (Signed)
Pt slept 8hrs, was up x1 last night for c/o insomnia with withdrawal symptoms; received a One time order for Librium s; pt was able to return to bed without any difficulty. Safety monitored at all times.
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
## 2014-12-13 NOTE — Telephone Encounter (Signed)
Tried to contact this patient to inform him of Dr. Debby Freiberg message below, but there was no answer. A message was left stating there was an issue regarding his refill request and he was asked to give me a call back so that I can review it with him.   Edwena Felty, MD   Franki Monte, CMA (You) 21 hours ago   (1:12 PM)    Please convey to Mr. Xiong that I have been reviewing documents sent to me from his recent hospitalization. His pain contract and management with me was contingent on his sobriety from alcohol and avoiding illicit drugs like heroine. I will no longer be able to manage his pain and prescribe controled substances for his pain as he has broken his pain contract. If he would like I can try and see if a chronic pain specialist would be willing to take on his care but that is all I can offer at this time.

## 2014-12-19 ENCOUNTER — Encounter: Payer: Self-pay | Admitting: *Deleted

## 2014-12-19 ENCOUNTER — Other Ambulatory Visit: Payer: Self-pay | Admitting: *Deleted

## 2014-12-20 ENCOUNTER — Other Ambulatory Visit: Payer: Self-pay | Admitting: Gastroenterology

## 2014-12-20 DIAGNOSIS — K7460 Unspecified cirrhosis of liver: Secondary | ICD-10-CM

## 2014-12-21 ENCOUNTER — Ambulatory Visit (INDEPENDENT_AMBULATORY_CARE_PROVIDER_SITE_OTHER): Payer: Medicaid Other | Admitting: Family Medicine

## 2014-12-21 ENCOUNTER — Encounter: Payer: Self-pay | Admitting: Family Medicine

## 2014-12-21 VITALS — BP 148/80 | HR 77 | Temp 98.8°F | Resp 16 | Wt 214.3 lb

## 2014-12-21 DIAGNOSIS — B182 Chronic viral hepatitis C: Secondary | ICD-10-CM | POA: Diagnosis not present

## 2014-12-21 DIAGNOSIS — F1120 Opioid dependence, uncomplicated: Secondary | ICD-10-CM | POA: Diagnosis not present

## 2014-12-21 DIAGNOSIS — F1020 Alcohol dependence, uncomplicated: Secondary | ICD-10-CM | POA: Diagnosis not present
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
## 2014-12-28 ENCOUNTER — Ambulatory Visit: Payer: Medicaid Other

## 2015-01-01 ENCOUNTER — Emergency Department
Admission: EM | Admit: 2015-01-01 | Discharge: 2015-01-01 | Disposition: A | Discharge status: Home / Self Care | Payer: Medicaid Other | Attending: Emergency Medicine | Admitting: Emergency Medicine

## 2015-01-01 DIAGNOSIS — Z7982 Long term (current) use of aspirin: Secondary | ICD-10-CM | POA: Diagnosis not present

## 2015-01-01 DIAGNOSIS — F1310 Sedative, hypnotic or anxiolytic abuse, uncomplicated: Secondary | ICD-10-CM | POA: Insufficient documentation

## 2015-01-01 DIAGNOSIS — F10930 Alcohol use, unspecified with withdrawal, uncomplicated: Secondary | ICD-10-CM

## 2015-01-01 DIAGNOSIS — Z79899 Other long term (current) drug therapy: Secondary | ICD-10-CM | POA: Insufficient documentation

## 2015-01-01 DIAGNOSIS — F99 Mental disorder, not otherwise specified: Secondary | ICD-10-CM | POA: Diagnosis present

## 2015-01-01 DIAGNOSIS — Z720 Tobacco use: Secondary | ICD-10-CM | POA: Diagnosis not present

## 2015-01-01 DIAGNOSIS — I10 Essential (primary) hypertension: Secondary | ICD-10-CM | POA: Insufficient documentation

## 2015-01-01 DIAGNOSIS — F10230 Alcohol dependence with withdrawal, uncomplicated: Secondary | ICD-10-CM | POA: Diagnosis not present

## 2015-01-01 LAB — CBC
HEMATOCRIT: 50.5 % (ref 40.0–52.0)
HEMOGLOBIN: 17.1 g/dL (ref 13.0–18.0)
MCH: 35.3 pg — ABNORMAL HIGH (ref 26.0–34.0)
MCHC: 33.9 g/dL (ref 32.0–36.0)
MCV: 104 fL — ABNORMAL HIGH (ref 80.0–100.0)
Platelets: 89 10*3/uL — ABNORMAL LOW (ref 150–440)
RBC: 4.86 MIL/uL (ref 4.40–5.90)
RDW: 12.4 % (ref 11.5–14.5)
WBC: 8.2 10*3/uL (ref 3.8–10.6)

## 2015-01-01 LAB — COMPREHENSIVE METABOLIC PANEL
ALBUMIN: 4 g/dL (ref 3.5–5.0)
ALK PHOS: 89 U/L (ref 38–126)
ALT: 25 U/L (ref 17–63)
AST: 45 U/L — AB (ref 15–41)
Anion gap: 10 (ref 5–15)
BILIRUBIN TOTAL: 1.9 mg/dL — AB (ref 0.3–1.2)
BUN: 11 mg/dL (ref 6–20)
CALCIUM: 9.4 mg/dL (ref 8.9–10.3)
CO2: 31 mmol/L (ref 22–32)
Chloride: 102 mmol/L (ref 101–111)
Creatinine, Ser: 0.83 mg/dL (ref 0.61–1.24)
GFR calc Af Amer: >60 mL/min (ref >60)
GFR calc non Af Amer: >60 mL/min (ref >60)
GLUCOSE: 146 mg/dL — AB (ref 65–99)
POTASSIUM: 3.7 mmol/L (ref 3.5–5.1)
Sodium: 143 mmol/L (ref 135–145)
TOTAL PROTEIN: 8.5 g/dL — AB (ref 6.5–8.1)

## 2015-01-01 LAB — LIPASE, BLOOD: Lipase: 42 U/L (ref 22–51)

## 2015-01-01 LAB — ACETAMINOPHEN LEVEL: Acetaminophen (Tylenol), Serum: <10 ug/mL — ABNORMAL LOW (ref 10–30)

## 2015-01-01 LAB — URINALYSIS COMPLETE WITH MICROSCOPIC (ARMC ONLY)
BILIRUBIN URINE: NEGATIVE
Bacteria, UA: NONE SEEN
Glucose, UA: NEGATIVE mg/dL
HGB URINE DIPSTICK: NEGATIVE
KETONES UR: NEGATIVE mg/dL
LEUKOCYTES UA: NEGATIVE
Nitrite: NEGATIVE
PH: 9 — AB (ref 5.0–8.0)
Protein, ur: 100 mg/dL — AB
SPECIFIC GRAVITY, URINE: 1.025 (ref 1.005–1.030)
SQUAMOUS EPITHELIAL / LPF: NONE SEEN

## 2015-01-01 LAB — ETHANOL: Alcohol, Ethyl (B): <5 mg/dL (ref <5)

## 2015-01-01 LAB — URINE DRUG SCREEN, QUALITATIVE (ARMC ONLY)
AMPHETAMINES, UR SCREEN: NOT DETECTED
BENZODIAZEPINE, UR SCRN: POSITIVE — AB
Barbiturates, Ur Screen: NOT DETECTED
Cannabinoid 50 Ng, Ur ~~LOC~~: NOT DETECTED
Cocaine Metabolite,Ur ~~LOC~~: NOT DETECTED
MDMA (Ecstasy)Ur Screen: NOT DETECTED
METHADONE SCREEN, URINE: NOT DETECTED
OPIATE, UR SCREEN: NOT DETECTED
Phencyclidine (PCP) Ur S: NOT DETECTED
Tricyclic, Ur Screen: NOT DETECTED

## 2015-01-01 LAB — SALICYLATE LEVEL

## 2015-01-01 LAB — TSH: TSH: 0.774 u[IU]/mL (ref 0.350–4.500)

## 2015-01-01 MED ORDER — LORAZEPAM 2 MG PO TABS
0.0000 mg | ORAL_TABLET | Freq: Four times a day (QID) | ORAL | Status: DC
  Administered 2015-01-01: 1 mg via ORAL

## 2015-01-01 MED ORDER — ONDANSETRON 4 MG PO TBDP
ORAL_TABLET | ORAL | Status: AC
  Administered 2015-01-01: 4 mg via ORAL
  Filled 2015-01-01: qty 1

## 2015-01-01 MED ORDER — LORAZEPAM 1 MG PO TABS
ORAL_TABLET | ORAL | Status: AC
  Administered 2015-01-01: 1 mg via ORAL
  Filled 2015-01-01: qty 1

## 2015-01-01 MED ORDER — VITAMIN B-1 100 MG PO TABS
100.0000 mg | ORAL_TABLET | Freq: Every day | ORAL | Status: DC
  Administered 2015-01-01: 100 mg via ORAL
  Filled 2015-01-01: qty 1

## 2015-01-01 MED ORDER — LORAZEPAM 1 MG PO TABS
1.0000 mg | ORAL_TABLET | Freq: Once | ORAL | Status: AC
  Administered 2015-01-01: 1 mg via ORAL
  Filled 2015-01-01: qty 1

## 2015-01-01 MED ORDER — ONDANSETRON 4 MG PO TBDP
4.0000 mg | ORAL_TABLET | Freq: Once | ORAL | Status: AC
  Administered 2015-01-01: 4 mg via ORAL
  Filled 2015-01-01: qty 1

## 2015-01-01 MED ORDER — THIAMINE HCL 100 MG/ML IJ SOLN: 100.0000 mg | Freq: Every day | INTRAMUSCULAR | Status: DC

## 2015-01-01 MED ORDER — LORAZEPAM 2 MG/ML IJ SOLN: 0.0000 mg | Freq: Four times a day (QID) | INTRAMUSCULAR | Status: DC

## 2015-01-01 MED ORDER — LORAZEPAM 2 MG/ML IJ SOLN: 0.0000 mg | Freq: Two times a day (BID) | INTRAMUSCULAR | Status: DC

## 2015-01-01 MED ORDER — ONDANSETRON 4 MG PO TBDP
4.0000 mg | ORAL_TABLET | Freq: Once | ORAL | Status: AC
  Administered 2015-01-01: 4 mg via ORAL

## 2015-01-01 MED ORDER — LORAZEPAM 2 MG PO TABS: 0.0000 mg | ORAL_TABLET | Freq: Two times a day (BID) | ORAL | Status: DC
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
## 2015-01-01 NOTE — [facility] Counselor (Signed)
Pt to be discharged per EDP Dr.Gayle. Pt provided with community behavioral health and SA resources ([facility], [place]).

## 2015-01-01 NOTE — [facility] Counselor (Signed)
Pt referred to [facility]- awaiting referral outcome

## 2015-01-01 NOTE — ED Provider Notes (Signed)
-----------------------------------------   2:16 PM on 01/01/2015 -----------------------------------------  Labs reviewed by me and are generally unremarkable with the exception of chronic thrombocytopenia. Mild T bili elevation at 1.9 is nonspecific. Discussed case with behavioral health and the patient has been denied [facility]. [facility] for further evaluation of substance abuse treatment. Vital signs stable with the exception of mild bradycardia but he is maintaining adequate blood pressure and is asymptomatic. DC  Gayla Doss, MD 01/06/15 [phone]

## 2015-01-01 NOTE — ED Notes (Signed)
pts wife at bedside 

## 2015-01-01 NOTE — ED Notes (Signed)
Awaiting [facility] referal, pt resting in bed in no distress

## 2015-01-01 NOTE — ED Notes (Signed)
Pt states he is nauseas and asking for medicine, MD notified, no orders recieved

## 2015-01-01 NOTE — [facility] Counselor (Signed)
Per ER Staff(Dr.Gayle) the pt. doesn't need to be seen by Behavioral Medicine. Pt reports neither SI/HI nor psychosis. Pt to be referred to [facility].

## 2015-01-01 NOTE — ED Notes (Signed)
Pt given apple juice, pt denies wanting any food

## 2015-01-01 NOTE — [facility] Counselor (Signed)
Pt not accepted to [facility]- no beds available

## 2015-01-01 NOTE — ED Notes (Addendum)
Pt to triage via w/c with no distress noted; pt reports here week ago for detox; st was hospitalized here for 4 days for same thing; wife accomp pt and reports that pt took medication from friend who stated that he was giving him percocet but it was "5-70mg  osteoporosis medication"; pt reports last drink alcohol yesterday

## 2015-01-01 NOTE — ED Notes (Signed)
Pt given lunch tray and sprite  

## 2015-01-01 NOTE — ED Notes (Signed)
Spoke with Dr. Zenda Alpers regarding patient's presenting c/o and course of treatment. Per MD patient is for detox only and does not need to be dressed out. Patient remains in his own clothing at this time.

## 2015-01-02 ENCOUNTER — Ambulatory Visit
Admission: RE | Admit: 2015-01-02 | Discharge: 2015-01-02 | Disposition: A | Discharge status: Home / Self Care | Payer: Medicaid Other | Source: Ambulatory Visit | Attending: Gastroenterology | Admitting: Gastroenterology

## 2015-01-02 DIAGNOSIS — K8020 Calculus of gallbladder without cholecystitis without obstruction: Secondary | ICD-10-CM | POA: Insufficient documentation

## 2015-01-02 DIAGNOSIS — K7460 Unspecified cirrhosis of liver: Secondary | ICD-10-CM | POA: Insufficient documentation

## 2015-01-03 ENCOUNTER — Telehealth: Payer: Self-pay | Admitting: Radiology

## 2015-01-03 ENCOUNTER — Encounter: Payer: Self-pay | Admitting: Family Medicine

## 2015-01-03 ENCOUNTER — Ambulatory Visit (INDEPENDENT_AMBULATORY_CARE_PROVIDER_SITE_OTHER): Payer: Medicaid Other | Admitting: Family Medicine

## 2015-01-03 VITALS — BP 148/90 | HR 87 | Temp 98.0°F | Resp 18 | Ht 61.0 in | Wt 207.0 lb

## 2015-01-03 DIAGNOSIS — G4700 Insomnia, unspecified: Secondary | ICD-10-CM | POA: Diagnosis not present

## 2015-01-03 DIAGNOSIS — F1020 Alcohol dependence, uncomplicated: Secondary | ICD-10-CM

## 2015-01-03 DIAGNOSIS — E039 Hypothyroidism, unspecified: Secondary | ICD-10-CM

## 2015-01-03 DIAGNOSIS — F1120 Opioid dependence, uncomplicated: Secondary | ICD-10-CM

## 2015-01-03 DIAGNOSIS — G8929 Other chronic pain: Secondary | ICD-10-CM | POA: Insufficient documentation

## 2015-01-03 DIAGNOSIS — R1010 Upper abdominal pain, unspecified: Secondary | ICD-10-CM | POA: Diagnosis not present

## 2015-01-03 DIAGNOSIS — F332 Major depressive disorder, recurrent severe without psychotic features: Secondary | ICD-10-CM | POA: Diagnosis not present

## 2015-01-03 DIAGNOSIS — R1011 Right upper quadrant pain: Secondary | ICD-10-CM

## 2015-01-03 MED ORDER — DIAZEPAM 10 MG PO TABS: 10.0000 mg | ORAL_TABLET | Freq: Every evening | ORAL | Status: DC | PRN
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
## 2015-01-04 ENCOUNTER — Ambulatory Visit: Payer: Medicaid Other | Admitting: Urology

## 2015-01-22 ENCOUNTER — Ambulatory Visit: Payer: Self-pay | Admitting: Family Medicine

## 2015-02-09 ENCOUNTER — Emergency Department
Admission: EM | Admit: 2015-02-09 | Discharge: 2015-02-10 | Disposition: A | Discharge status: Home / Self Care | Payer: Medicaid Other | Attending: Emergency Medicine | Admitting: Emergency Medicine

## 2015-02-09 ENCOUNTER — Emergency Department: Payer: Medicaid Other

## 2015-02-09 ENCOUNTER — Encounter: Payer: Self-pay | Admitting: Emergency Medicine

## 2015-02-09 DIAGNOSIS — Z7982 Long term (current) use of aspirin: Secondary | ICD-10-CM | POA: Insufficient documentation

## 2015-02-09 DIAGNOSIS — F10920 Alcohol use, unspecified with intoxication, uncomplicated: Secondary | ICD-10-CM

## 2015-02-09 DIAGNOSIS — S0101XA Laceration without foreign body of scalp, initial encounter: Secondary | ICD-10-CM | POA: Diagnosis not present

## 2015-02-09 DIAGNOSIS — F911 Conduct disorder, childhood-onset type: Secondary | ICD-10-CM | POA: Insufficient documentation

## 2015-02-09 DIAGNOSIS — Y9389 Activity, other specified: Secondary | ICD-10-CM | POA: Diagnosis not present

## 2015-02-09 DIAGNOSIS — S0990XA Unspecified injury of head, initial encounter: Secondary | ICD-10-CM | POA: Diagnosis present

## 2015-02-09 DIAGNOSIS — R454 Irritability and anger: Secondary | ICD-10-CM | POA: Insufficient documentation

## 2015-02-09 DIAGNOSIS — Z79899 Other long term (current) drug therapy: Secondary | ICD-10-CM | POA: Insufficient documentation

## 2015-02-09 DIAGNOSIS — F10120 Alcohol abuse with intoxication, uncomplicated: Secondary | ICD-10-CM | POA: Insufficient documentation

## 2015-02-09 DIAGNOSIS — F419 Anxiety disorder, unspecified: Secondary | ICD-10-CM | POA: Insufficient documentation

## 2015-02-09 DIAGNOSIS — I10 Essential (primary) hypertension: Secondary | ICD-10-CM | POA: Insufficient documentation

## 2015-02-09 DIAGNOSIS — F1310 Sedative, hypnotic or anxiolytic abuse, uncomplicated: Secondary | ICD-10-CM | POA: Diagnosis not present

## 2015-02-09 DIAGNOSIS — Z720 Tobacco use: Secondary | ICD-10-CM | POA: Diagnosis not present

## 2015-02-09 DIAGNOSIS — F1110 Opioid abuse, uncomplicated: Secondary | ICD-10-CM | POA: Insufficient documentation

## 2015-02-09 DIAGNOSIS — Y9289 Other specified places as the place of occurrence of the external cause: Secondary | ICD-10-CM | POA: Diagnosis not present

## 2015-02-09 DIAGNOSIS — Y998 Other external cause status: Secondary | ICD-10-CM | POA: Diagnosis not present

## 2015-02-09 LAB — URINE DRUG SCREEN, QUALITATIVE (ARMC ONLY)
AMPHETAMINES, UR SCREEN: NOT DETECTED
BARBITURATES, UR SCREEN: NOT DETECTED
BENZODIAZEPINE, UR SCRN: POSITIVE — AB
COCAINE METABOLITE, UR ~~LOC~~: NOT DETECTED
Cannabinoid 50 Ng, Ur ~~LOC~~: NOT DETECTED
MDMA (Ecstasy)Ur Screen: NOT DETECTED
Methadone Scn, Ur: NOT DETECTED
OPIATE, UR SCREEN: POSITIVE — AB
PHENCYCLIDINE (PCP) UR S: NOT DETECTED
Tricyclic, Ur Screen: NOT DETECTED

## 2015-02-09 LAB — COMPREHENSIVE METABOLIC PANEL
ALT: 40 U/L (ref 17–63)
ANION GAP: 8 (ref 5–15)
AST: 85 U/L — ABNORMAL HIGH (ref 15–41)
Albumin: 3.7 g/dL (ref 3.5–5.0)
Alkaline Phosphatase: 82 U/L (ref 38–126)
BUN: 7 mg/dL (ref 6–20)
CALCIUM: 8.3 mg/dL — AB (ref 8.9–10.3)
CHLORIDE: 104 mmol/L (ref 101–111)
CO2: 25 mmol/L (ref 22–32)
Creatinine, Ser: 0.69 mg/dL (ref 0.61–1.24)
Glucose, Bld: 103 mg/dL — ABNORMAL HIGH (ref 65–99)
Potassium: 3.8 mmol/L (ref 3.5–5.1)
SODIUM: 137 mmol/L (ref 135–145)
Total Bilirubin: 0.8 mg/dL (ref 0.3–1.2)
Total Protein: 7.6 g/dL (ref 6.5–8.1)

## 2015-02-09 LAB — ETHANOL: Alcohol, Ethyl (B): 175 mg/dL — ABNORMAL HIGH (ref <5)

## 2015-02-09 LAB — CBC
HCT: 43 % (ref 40.0–52.0)
Hemoglobin: 14.7 g/dL (ref 13.0–18.0)
MCH: 35.7 pg — AB (ref 26.0–34.0)
MCHC: 34.2 g/dL (ref 32.0–36.0)
MCV: 104.5 fL — AB (ref 80.0–100.0)
PLATELETS: 79 10*3/uL — AB (ref 150–440)
RBC: 4.12 MIL/uL — ABNORMAL LOW (ref 4.40–5.90)
RDW: 13.8 % (ref 11.5–14.5)
WBC: 7.8 10*3/uL (ref 3.8–10.6)

## 2015-02-09 LAB — APTT: APTT: 35 s (ref 24–36)

## 2015-02-09 LAB — PROTIME-INR
INR: 1.38
PROTHROMBIN TIME: 17.2 s — AB (ref 11.4–15.0)

## 2015-02-09 MED ORDER — LORAZEPAM 2 MG/ML IJ SOLN
1.0000 mg | Freq: Once | INTRAMUSCULAR | Status: AC
  Administered 2015-02-09: 1 mg via INTRAVENOUS

## 2015-02-09 MED ORDER — HALOPERIDOL LACTATE 5 MG/ML IJ SOLN
INTRAMUSCULAR | Status: AC
  Administered 2015-02-10: 5 mg via INTRAMUSCULAR
  Filled 2015-02-09: qty 1

## 2015-02-09 MED ORDER — LORAZEPAM 2 MG/ML IJ SOLN
INTRAMUSCULAR | Status: AC
  Administered 2015-02-09: 1 mg via INTRAVENOUS
  Filled 2015-02-09: qty 1

## 2015-02-09 MED ORDER — LIDOCAINE HCL (PF) 1 % IJ SOLN
30.0000 mL | Freq: Once | INTRAMUSCULAR | Status: AC
  Administered 2015-02-09: 30 mL via INTRADERMAL

## 2015-02-09 MED ORDER — LIDOCAINE-EPINEPHRINE (PF) 1 %-1:200000 IJ SOLN
INTRAMUSCULAR | Status: AC
  Filled 2015-02-09: qty 30
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
## 2015-02-09 NOTE — ED Notes (Signed)
Patient transported from CT 

## 2015-02-09 NOTE — ED Notes (Signed)
Patient transported to CT 

## 2015-02-09 NOTE — Discharge Instructions (Signed)
Alcohol Intoxication Alcohol intoxication occurs when you drink enough alcohol that it affects your ability to function. It can be mild or very severe. Drinking a lot of alcohol in a short time is called binge drinking. This can be very harmful. Drinking alcohol can also be more dangerous if you are taking medicines or other drugs. Some of the effects caused by alcohol may include:  Loss of coordination.  Changes in mood and behavior.  Unclear thinking.  Trouble talking (slurred speech).  Throwing up (vomiting).  Confusion.  Slowed breathing.  Twitching and shaking (seizures).  Loss of consciousness. HOME CARE  Do not drive after drinking alcohol.  Drink enough water and fluids to keep your pee (urine) clear or pale yellow. Avoid caffeine.  Only take medicine as told by your doctor. GET HELP IF:  You throw up (vomit) many times.  You do not feel better after a few days.  You frequently have alcohol intoxication. Your doctor can help decide if you should see a substance use treatment counselor. GET HELP RIGHT AWAY IF:  You become shaky when you stop drinking.  You have twitching and shaking.  You throw up blood. It may look bright red or like coffee grounds.  You notice blood in your poop (bowel movements).  You become lightheaded or pass out (faint). MAKE SURE YOU:   Understand these instructions.  Will watch your condition.  Will get help right away if you are not doing well or get worse.   This information is not intended to replace advice given to you by your health care provider. Make sure you discuss any questions you have with your health care provider.   Document Released: 09/16/2007 Document Revised: 11/30/2012 Document Reviewed: 09/02/2012 Elsevier Interactive Patient Education 2016 [employer]  Laceration Care, Adult A laceration is a cut that goes through all layers of the skin. The cut also goes into the tissue that is right under the skin.  Some cuts heal on their own. Others need to be closed with stitches (sutures), staples, skin adhesive strips, or wound glue. Taking care of your cut lowers your risk of infection and helps your cut to heal better. HOW TO TAKE CARE OF YOUR CUT For stitches or staples:  Keep the wound clean and dry.  If you were given a bandage (dressing), you should change it at least one time per day or as told by your doctor. You should also change it if it gets wet or dirty.  Keep the wound completely dry for the first 24 hours or as told by your doctor. After that time, you may take a shower or a bath. However, make sure that the wound is not soaked in water until after the stitches or staples have been removed.  Clean the wound one time each day or as told by your doctor:  Wash the wound with soap and water.  Rinse the wound with water until all of the soap comes off.  Pat the wound dry with a clean towel. Do not rub the wound.  After you clean the wound, put a thin layer of antibiotic ointment on it as told by your doctor. This ointment:  Helps to prevent infection.  Keeps the bandage from sticking to the wound.  Have your stitches or staples removed as told by your doctor. If your doctor used skin adhesive strips:   Keep the wound clean and dry.  If you were given a bandage, you should change it at least one  time per day or as told by your doctor. You should also change it if it gets dirty or wet.  Do not get the skin adhesive strips wet. You can take a shower or a bath, but be careful to keep the wound dry.  If the wound gets wet, pat it dry with a clean towel. Do not rub the wound.  Skin adhesive strips fall off on their own. You can trim the strips as the wound heals. Do not remove any strips that are still stuck to the wound. They will fall off after a while. If your doctor used wound glue:  Try to keep your wound dry, but you may briefly wet it in the shower or bath. Do not soak the  wound in water, such as by swimming.  After you take a shower or a bath, gently pat the wound dry with a clean towel. Do not rub the wound.  Do not do any activities that will make you really sweaty until the skin glue has fallen off on its own.  Do not apply liquid, cream, or ointment medicine to your wound while the skin glue is still on.  If you were given a bandage, you should change it at least one time per day or as told by your doctor. You should also change it if it gets dirty or wet.  If a bandage is placed over the wound, do not let the tape for the bandage touch the skin glue.  Do not pick at the glue. The skin glue usually stays on for 5-10 days. Then, it falls off of the skin. General Instructions  To help prevent scarring, make sure to cover your wound with sunscreen whenever you are outside after stitches are removed, after adhesive strips are removed, or when wound glue stays in place and the wound is healed. Make sure to wear a sunscreen of at least 30 SPF.  Take over-the-counter and prescription medicines only as told by your doctor.  If you were given antibiotic medicine or ointment, take or apply it as told by your doctor. Do not stop using the antibiotic even if your wound is getting better.  Do not scratch or pick at the wound.  Keep all follow-up visits as told by your doctor. This is important.  Check your wound every day for signs of infection. Watch for:  Redness, swelling, or pain.  Fluid, blood, or pus.  Raise (elevate) the injured area above the level of your heart while you are sitting or lying down, if possible. GET HELP IF:  You got a tetanus shot and you have any of these problems at the injection site:  Swelling.  Very bad pain.  Redness.  Bleeding.  You have a fever.  A wound that was closed breaks open.  You notice a bad smell coming from your wound or your bandage.  You notice something coming out of the wound, such as wood or  glass.  Medicine does not help your pain.  You have more redness, swelling, or pain at the site of your wound.  You have fluid, blood, or pus coming from your wound.  You notice a change in the color of your skin near your wound.  You need to change the bandage often because fluid, blood, or pus is coming from the wound.  You start to have a new rash.  You start to have numbness around the wound. GET HELP RIGHT AWAY IF:  You have very bad swelling  around the wound.  Your pain suddenly gets worse and is very bad.  You notice painful lumps near the wound or on skin that is anywhere on your body.  You have a red streak going away from your wound.  The wound is on your hand or foot and you cannot move a finger or toe like you usually can.  The wound is on your hand or foot and you notice that your fingers or toes look pale or bluish.   This information is not intended to replace advice given to you by your health care provider. Make sure you discuss any questions you have with your health care provider.   Document Released: 09/16/2007 Document Revised: 08/14/2014 Document Reviewed: 03/26/2014 Elsevier Interactive Patient Education [employer]
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
## 2015-02-10 MED ORDER — HALOPERIDOL LACTATE 5 MG/ML IJ SOLN
5.0000 mg | Freq: Once | INTRAMUSCULAR | Status: AC
  Administered 2015-02-10: 5 mg via INTRAMUSCULAR

## 2015-02-10 NOTE — ED Notes (Signed)
Pt with even and unlabored respirations.  Pt appears to be sleeping.

## 2015-02-10 NOTE — ED Notes (Signed)
Pt requests to go home, wife Traci called at this time to pick patient up. States that she will come pick patient up. Awaiting her arrival at this time.

## 2015-02-10 NOTE — ED Notes (Signed)
Pt refuses offer for tylenol or ibuprofen. Explained to patient and wife what symptoms to look for; drowsiness, confusion, etc. Pt wife driving patient home. PT ambulatory, alert and oriented X4. Pt informed to return for staple removal or before for any of the above symptoms explained to wife.

## 2015-02-10 NOTE — ED Notes (Addendum)
Pt left with wife. Pt ambulatory, alert and oriented X4. No bleeding. Bandage on head intact upon discharge.

## 2015-02-10 NOTE — ED Notes (Signed)
Pt sleeping at this time.

## 2015-02-10 NOTE — ED Provider Notes (Signed)
-----------------------------------------   8:08 AM on 02/10/2015 -----------------------------------------   Blood pressure 123/84, pulse 85, temperature 98.1 F (36.7 C), temperature source Oral, resp. rate 18, height 6\' 2"  (1.88 m), weight 220 lb (99.791 kg), SpO2 94 %.  The patient had no acute events since last update.  Calm and cooperative at this time.  Disposition is pending per Psychiatry/Behavioral Medicine team recommendations.     Arnaldo NatalPaul F Graysyn Bache, MD 02/10/15 [phone]

## 2015-02-10 NOTE — ED Notes (Signed)
Pt provided lunch box.

## 2015-02-20 ENCOUNTER — Encounter: Payer: Self-pay | Admitting: Anesthesiology

## 2015-02-20 ENCOUNTER — Ambulatory Visit: Payer: Medicaid Other | Attending: Pain Medicine | Admitting: Anesthesiology

## 2015-02-20 ENCOUNTER — Ambulatory Visit: Payer: Medicaid Other | Admitting: Anesthesiology

## 2015-02-20 ENCOUNTER — Other Ambulatory Visit: Payer: Self-pay | Admitting: Anesthesiology

## 2015-02-20 ENCOUNTER — Ambulatory Visit: Payer: Self-pay | Admitting: Pain Medicine

## 2015-02-20 VITALS — BP 154/89 | HR 58 | Temp 98.2°F | Resp 16 | Ht 73.0 in | Wt 210.0 lb

## 2015-02-20 DIAGNOSIS — B1920 Unspecified viral hepatitis C without hepatic coma: Secondary | ICD-10-CM | POA: Diagnosis not present

## 2015-02-20 DIAGNOSIS — E039 Hypothyroidism, unspecified: Secondary | ICD-10-CM | POA: Insufficient documentation

## 2015-02-20 DIAGNOSIS — F17200 Nicotine dependence, unspecified, uncomplicated: Secondary | ICD-10-CM | POA: Insufficient documentation

## 2015-02-20 DIAGNOSIS — R1031 Right lower quadrant pain: Secondary | ICD-10-CM | POA: Diagnosis present

## 2015-02-20 DIAGNOSIS — F1020 Alcohol dependence, uncomplicated: Secondary | ICD-10-CM | POA: Insufficient documentation

## 2015-02-20 DIAGNOSIS — K7460 Unspecified cirrhosis of liver: Secondary | ICD-10-CM | POA: Insufficient documentation

## 2015-02-20 DIAGNOSIS — K7030 Alcoholic cirrhosis of liver without ascites: Secondary | ICD-10-CM

## 2015-02-20 DIAGNOSIS — N400 Enlarged prostate without lower urinary tract symptoms: Secondary | ICD-10-CM | POA: Insufficient documentation

## 2015-02-20 DIAGNOSIS — R1011 Right upper quadrant pain: Secondary | ICD-10-CM | POA: Diagnosis not present

## 2015-02-20 DIAGNOSIS — B182 Chronic viral hepatitis C: Secondary | ICD-10-CM

## 2015-02-20 DIAGNOSIS — F0390 Unspecified dementia without behavioral disturbance: Secondary | ICD-10-CM | POA: Insufficient documentation

## 2015-02-20 DIAGNOSIS — F1110 Opioid abuse, uncomplicated: Secondary | ICD-10-CM | POA: Diagnosis not present

## 2015-02-20 DIAGNOSIS — F1021 Alcohol dependence, in remission: Secondary | ICD-10-CM

## 2015-02-20 MED ORDER — BUPRENORPHINE 5 MCG/HR TD PTWK: 5.0000 ug | MEDICATED_PATCH | TRANSDERMAL | Status: DC

## 2015-02-20 NOTE — Progress Notes (Signed)
Safety precautions to be maintained throughout the outpatient stay will include: orient to surroundings, keep bed in low position, maintain call bell within reach at all times, provide assistance with transfer out of bed and ambulation.  

## 2015-02-25 ENCOUNTER — Encounter: Payer: Self-pay | Admitting: Anesthesiology

## 2015-02-25 DIAGNOSIS — F1021 Alcohol dependence, in remission: Secondary | ICD-10-CM | POA: Insufficient documentation
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
## 2015-02-27 ENCOUNTER — Other Ambulatory Visit: Payer: Self-pay | Admitting: Family Medicine

## 2015-02-27 MED ORDER — LEVOTHYROXINE SODIUM 112 MCG PO CAPS: 1.0000 | ORAL_CAPSULE | Freq: Every day | ORAL | Status: AC

## 2015-02-27 NOTE — Telephone Encounter (Signed)
Refill request was sent to Dr. Ashany Sundaram for approval and submission.  

## 2015-02-27 NOTE — Telephone Encounter (Signed)
Completely out of tirosint and would like for you to send it to [facility].

## 2015-03-01 LAB — TOXASSURE SELECT 13 (MW), URINE: PDF: 0

## 2015-03-13 ENCOUNTER — Ambulatory Visit: Payer: Self-pay | Admitting: Anesthesiology

## 2015-03-13 NOTE — Progress Notes (Signed)
   Subjective:    Patient ID: Kyle Gomez, male    DOB: 05/18/1960, 54 y.o.   MRN: [id number]  HPI    Review of Systems     Objective:   Physical Exam        Assessment & Plan:

## 2015-04-14 ENCOUNTER — Encounter: Payer: Self-pay | Admitting: Anesthesiology

## 2015-05-01 ENCOUNTER — Ambulatory Visit (INDEPENDENT_AMBULATORY_CARE_PROVIDER_SITE_OTHER): Payer: Medicaid Other | Admitting: Family Medicine

## 2015-05-01 ENCOUNTER — Encounter: Payer: Self-pay | Admitting: Family Medicine

## 2015-05-01 VITALS — BP 144/100 | HR 72 | Temp 98.9°F | Resp 16 | Ht 73.0 in | Wt 212.0 lb

## 2015-05-01 DIAGNOSIS — G8929 Other chronic pain: Secondary | ICD-10-CM | POA: Diagnosis not present

## 2015-05-01 DIAGNOSIS — B182 Chronic viral hepatitis C: Secondary | ICD-10-CM | POA: Diagnosis not present

## 2015-05-01 DIAGNOSIS — K7031 Alcoholic cirrhosis of liver with ascites: Secondary | ICD-10-CM | POA: Diagnosis not present

## 2015-05-01 DIAGNOSIS — R1011 Right upper quadrant pain: Secondary | ICD-10-CM

## 2015-05-01 DIAGNOSIS — R1010 Upper abdominal pain, unspecified: Secondary | ICD-10-CM
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
## 2015-05-02 ENCOUNTER — Telehealth: Payer: Self-pay | Admitting: Family Medicine

## 2015-05-02 ENCOUNTER — Telehealth: Payer: Self-pay

## 2015-05-02 NOTE — Telephone Encounter (Signed)
Please explain to patient and wife that I am not quite sure if Oncology is the appropriate referral for liver cirrhosis which is not quite cancer but can certainly lead to liver cancer. I would recommend as I did during our office visit that he follows up with his liver specialist first and determine his prognosis and have it documented if he qualifies for palliative care. Have his liver specialist send me their notes or call me directly to discuss his case. I can not just refer him arbitrarily, there needs to be documented evidence to qualify for particular referrals.

## 2015-05-02 NOTE — Telephone Encounter (Signed)
Called patient & left voice mail.

## 2015-05-02 NOTE — Telephone Encounter (Signed)
Please refer him to [facility] pain clinic. Patient states liver Dr states its out of his hands, and Patient is not ready for pallitive care.

## 2015-05-02 NOTE — Telephone Encounter (Signed)
Patient is requesting a referral to be sent to the cancer center or [facility]. But patient prefer [facility]. States dr Sherley Bounds told them to make appointment at one of these offices however when they called the people told them that our office had to make the appointment. Patient is requesting a rush on this appointment because he is in a lot of pain

## 2015-05-03 ENCOUNTER — Other Ambulatory Visit: Payer: Self-pay | Admitting: Family Medicine

## 2015-05-03 DIAGNOSIS — G894 Chronic pain syndrome: Secondary | ICD-10-CM

## 2015-05-03 DIAGNOSIS — F1920 Other psychoactive substance dependence, uncomplicated: Secondary | ICD-10-CM

## 2015-05-03 DIAGNOSIS — B182 Chronic viral hepatitis C: Secondary | ICD-10-CM

## 2015-05-03 DIAGNOSIS — F1120 Opioid dependence, uncomplicated: Secondary | ICD-10-CM

## 2015-05-03 DIAGNOSIS — R1011 Right upper quadrant pain: Secondary | ICD-10-CM

## 2015-05-03 DIAGNOSIS — G8929 Other chronic pain: Secondary | ICD-10-CM

## 2015-05-03 NOTE — Telephone Encounter (Signed)
I am not quite sure there is a "[facility] pain clinic". What other pain clinics has the patient been to locally? Has he been to [facility] pain clinic? This would be the easiest to secure.

## 2015-07-12 ENCOUNTER — Observation Stay
Admission: EM | Admit: 2015-07-12 | Discharge: 2015-07-12 | Discharge status: Against Medical Advice | Payer: Medicaid Other | Attending: Internal Medicine | Admitting: Internal Medicine

## 2015-07-12 ENCOUNTER — Emergency Department: Payer: Medicaid Other

## 2015-07-12 DIAGNOSIS — D696 Thrombocytopenia, unspecified: Secondary | ICD-10-CM | POA: Diagnosis not present

## 2015-07-12 DIAGNOSIS — E079 Disorder of thyroid, unspecified: Secondary | ICD-10-CM | POA: Insufficient documentation

## 2015-07-12 DIAGNOSIS — Z9889 Other specified postprocedural states: Secondary | ICD-10-CM | POA: Insufficient documentation

## 2015-07-12 DIAGNOSIS — F319 Bipolar disorder, unspecified: Secondary | ICD-10-CM | POA: Insufficient documentation

## 2015-07-12 DIAGNOSIS — R410 Disorientation, unspecified: Secondary | ICD-10-CM

## 2015-07-12 DIAGNOSIS — G9340 Encephalopathy, unspecified: Secondary | ICD-10-CM | POA: Diagnosis not present

## 2015-07-12 DIAGNOSIS — Z803 Family history of malignant neoplasm of breast: Secondary | ICD-10-CM | POA: Insufficient documentation

## 2015-07-12 DIAGNOSIS — R4182 Altered mental status, unspecified: Principal | ICD-10-CM | POA: Insufficient documentation

## 2015-07-12 DIAGNOSIS — N400 Enlarged prostate without lower urinary tract symptoms: Secondary | ICD-10-CM | POA: Insufficient documentation

## 2015-07-12 DIAGNOSIS — Z888 Allergy status to other drugs, medicaments and biological substances status: Secondary | ICD-10-CM | POA: Diagnosis not present

## 2015-07-12 DIAGNOSIS — F1010 Alcohol abuse, uncomplicated: Secondary | ICD-10-CM | POA: Insufficient documentation

## 2015-07-12 DIAGNOSIS — F17210 Nicotine dependence, cigarettes, uncomplicated: Secondary | ICD-10-CM | POA: Diagnosis not present

## 2015-07-12 DIAGNOSIS — R29810 Facial weakness: Secondary | ICD-10-CM | POA: Diagnosis not present

## 2015-07-12 DIAGNOSIS — E785 Hyperlipidemia, unspecified: Secondary | ICD-10-CM | POA: Insufficient documentation

## 2015-07-12 DIAGNOSIS — K7460 Unspecified cirrhosis of liver: Secondary | ICD-10-CM | POA: Insufficient documentation

## 2015-07-12 DIAGNOSIS — G8929 Other chronic pain: Secondary | ICD-10-CM | POA: Insufficient documentation

## 2015-07-12 DIAGNOSIS — Z91018 Allergy to other foods: Secondary | ICD-10-CM | POA: Diagnosis not present

## 2015-07-12 DIAGNOSIS — F419 Anxiety disorder, unspecified: Secondary | ICD-10-CM | POA: Insufficient documentation

## 2015-07-12 DIAGNOSIS — Z791 Long term (current) use of non-steroidal anti-inflammatories (NSAID): Secondary | ICD-10-CM | POA: Diagnosis not present

## 2015-07-12 DIAGNOSIS — K219 Gastro-esophageal reflux disease without esophagitis: Secondary | ICD-10-CM | POA: Diagnosis not present

## 2015-07-12 DIAGNOSIS — Z79899 Other long term (current) drug therapy: Secondary | ICD-10-CM | POA: Diagnosis not present

## 2015-07-12 DIAGNOSIS — B182 Chronic viral hepatitis C: Secondary | ICD-10-CM | POA: Insufficient documentation

## 2015-07-12 DIAGNOSIS — R4781 Slurred speech: Secondary | ICD-10-CM | POA: Diagnosis present

## 2015-07-12 DIAGNOSIS — Z886 Allergy status to analgesic agent status: Secondary | ICD-10-CM | POA: Insufficient documentation

## 2015-07-12 DIAGNOSIS — I10 Essential (primary) hypertension: Secondary | ICD-10-CM | POA: Diagnosis not present

## 2015-07-12 LAB — BASIC METABOLIC PANEL
ANION GAP: 4 — AB (ref 5–15)
BUN: 15 mg/dL (ref 6–20)
CHLORIDE: 109 mmol/L (ref 101–111)
CO2: 26 mmol/L (ref 22–32)
Calcium: 8.3 mg/dL — ABNORMAL LOW (ref 8.9–10.3)
Creatinine, Ser: 0.78 mg/dL (ref 0.61–1.24)
GFR calc Af Amer: >60 mL/min (ref >60)
GFR calc non Af Amer: >60 mL/min (ref >60)
GLUCOSE: 108 mg/dL — AB (ref 65–99)
POTASSIUM: 3.6 mmol/L (ref 3.5–5.1)
Sodium: 139 mmol/L (ref 135–145)

## 2015-07-12 LAB — CBC
HCT: 41.2 % (ref 40.0–52.0)
HEMOGLOBIN: 14.3 g/dL (ref 13.0–18.0)
MCH: 36.1 pg — AB (ref 26.0–34.0)
MCHC: 34.6 g/dL (ref 32.0–36.0)
MCV: 104.5 fL — AB (ref 80.0–100.0)
PLATELETS: 55 10*3/uL — AB (ref 150–440)
RBC: 3.95 MIL/uL — ABNORMAL LOW (ref 4.40–5.90)
RDW: 12.6 % (ref 11.5–14.5)
WBC: 3.9 10*3/uL (ref 3.8–10.6)

## 2015-07-12 LAB — URINE DRUG SCREEN, QUALITATIVE (ARMC ONLY)
AMPHETAMINES, UR SCREEN: NOT DETECTED
BARBITURATES, UR SCREEN: NOT DETECTED
BENZODIAZEPINE, UR SCRN: POSITIVE — AB
Cannabinoid 50 Ng, Ur ~~LOC~~: NOT DETECTED
Cocaine Metabolite,Ur ~~LOC~~: NOT DETECTED
MDMA (Ecstasy)Ur Screen: NOT DETECTED
METHADONE SCREEN, URINE: POSITIVE — AB
OPIATE, UR SCREEN: POSITIVE — AB
Phencyclidine (PCP) Ur S: NOT DETECTED
TRICYCLIC, UR SCREEN: NOT DETECTED

## 2015-07-12 LAB — ETHANOL: Alcohol, Ethyl (B): 27 mg/dL — ABNORMAL HIGH (ref <5)

## 2015-07-12 LAB — TROPONIN I

## 2015-07-12 MED ORDER — SENNOSIDES-DOCUSATE SODIUM 8.6-50 MG PO TABS: 1.0000 | ORAL_TABLET | Freq: Every evening | ORAL | Status: DC | PRN

## 2015-07-12 MED ORDER — BUDESONIDE 0.25 MG/2ML IN SUSP
0.2500 mg | Freq: Two times a day (BID) | RESPIRATORY_TRACT | Status: DC
  Administered 2015-07-12: 0.25 mg via RESPIRATORY_TRACT
  Filled 2015-07-12: qty 2

## 2015-07-12 MED ORDER — GADOBENATE DIMEGLUMINE 529 MG/ML IV SOLN
20.0000 mL | Freq: Once | INTRAVENOUS | Status: AC | PRN
  Administered 2015-07-12: 20 mL via INTRAVENOUS

## 2015-07-12 MED ORDER — LEVOTHYROXINE SODIUM 112 MCG PO TABS: 112.0000 ug | ORAL_TABLET | Freq: Every day | ORAL | Status: DC

## 2015-07-12 MED ORDER — THIAMINE HCL 100 MG/ML IJ SOLN
100.0000 mg | Freq: Every day | INTRAMUSCULAR | Status: DC
  Filled 2015-07-12: qty 2

## 2015-07-12 MED ORDER — BUSPIRONE HCL 10 MG PO TABS
10.0000 mg | ORAL_TABLET | Freq: Three times a day (TID) | ORAL | Status: DC
  Administered 2015-07-12: 10 mg via ORAL
  Filled 2015-07-12: qty 1

## 2015-07-12 MED ORDER — NALOXONE HCL 2 MG/2ML IJ SOSY
PREFILLED_SYRINGE | INTRAMUSCULAR | Status: AC
  Filled 2015-07-12: qty 2

## 2015-07-12 MED ORDER — DIAZEPAM 5 MG PO TABS
5.0000 mg | ORAL_TABLET | Freq: Two times a day (BID) | ORAL | Status: DC
  Administered 2015-07-12: 12:00:00 5 mg via ORAL
  Filled 2015-07-12: qty 1

## 2015-07-12 MED ORDER — TAMSULOSIN HCL 0.4 MG PO CAPS
0.4000 mg | ORAL_CAPSULE | Freq: Every day | ORAL | Status: DC
Start: 2015-07-12 — End: 2015-07-12
  Administered 2015-07-12: 0.4 mg via ORAL
  Filled 2015-07-12: qty 1

## 2015-07-12 MED ORDER — FAMOTIDINE 20 MG PO TABS
10.0000 mg | ORAL_TABLET | Freq: Every day | ORAL | Status: DC
  Administered 2015-07-12: 10 mg via ORAL
  Filled 2015-07-12: qty 1

## 2015-07-12 MED ORDER — NALOXONE HCL 2 MG/2ML IJ SOSY
1.0000 mg | PREFILLED_SYRINGE | Freq: Once | INTRAMUSCULAR | Status: AC
  Administered 2015-07-12: 1 mg via INTRAVENOUS

## 2015-07-12 MED ORDER — HYDROXYZINE PAMOATE 25 MG PO CAPS
25.0000 mg | ORAL_CAPSULE | Freq: Three times a day (TID) | ORAL | Status: DC | PRN
  Filled 2015-07-12: qty 1

## 2015-07-12 MED ORDER — ATORVASTATIN CALCIUM 20 MG PO TABS: 40.0000 mg | ORAL_TABLET | Freq: Every day | ORAL | Status: DC

## 2015-07-12 MED ORDER — ALBUTEROL SULFATE (2.5 MG/3ML) 0.083% IN NEBU: 2.5000 mg | INHALATION_SOLUTION | RESPIRATORY_TRACT | Status: DC | PRN

## 2015-07-12 MED ORDER — FOLIC ACID 1 MG PO TABS
1.0000 mg | ORAL_TABLET | Freq: Every day | ORAL | Status: DC
  Administered 2015-07-12: 12:00:00 1 mg via ORAL
  Filled 2015-07-12: qty 1

## 2015-07-12 MED ORDER — CLOPIDOGREL BISULFATE 75 MG PO TABS
75.0000 mg | ORAL_TABLET | Freq: Every day | ORAL | Status: DC
Start: 2015-07-12 — End: 2015-07-12
  Administered 2015-07-12: 75 mg via ORAL
  Filled 2015-07-12: qty 1

## 2015-07-12 MED ORDER — BECLOMETHASONE DIPROPIONATE 80 MCG/ACT IN AERS
1.0000 | INHALATION_SPRAY | Freq: Every day | RESPIRATORY_TRACT | Status: DC
Start: 2015-07-12 — End: 2015-07-12

## 2015-07-12 MED ORDER — NADOLOL 20 MG PO TABS
40.0000 mg | ORAL_TABLET | Freq: Every day | ORAL | Status: DC
  Administered 2015-07-12: 40 mg via ORAL
  Filled 2015-07-12: qty 2

## 2015-07-12 MED ORDER — LORAZEPAM 1 MG PO TABS: 1.0000 mg | ORAL_TABLET | Freq: Four times a day (QID) | ORAL | Status: DC | PRN

## 2015-07-12 MED ORDER — VITAMIN B-1 100 MG PO TABS
100.0000 mg | ORAL_TABLET | Freq: Every day | ORAL | Status: DC
  Administered 2015-07-12: 12:00:00 100 mg via ORAL
  Filled 2015-07-12: qty 1

## 2015-07-12 MED ORDER — ALBUTEROL SULFATE HFA 108 (90 BASE) MCG/ACT IN AERS: 1.0000 | INHALATION_SPRAY | RESPIRATORY_TRACT | Status: DC | PRN

## 2015-07-12 MED ORDER — LORAZEPAM 2 MG/ML IJ SOLN
1.0000 mg | Freq: Four times a day (QID) | INTRAMUSCULAR | Status: DC | PRN
  Administered 2015-07-12: 1 mg via INTRAVENOUS
  Filled 2015-07-12: qty 1

## 2015-07-12 MED ORDER — ADULT MULTIVITAMIN W/MINERALS CH
1.0000 | ORAL_TABLET | Freq: Every day | ORAL | Status: DC
  Administered 2015-07-12: 12:00:00 1 via ORAL
  Filled 2015-07-12: qty 1

## 2015-07-12 MED ORDER — STROKE: EARLY STAGES OF RECOVERY BOOK
Freq: Once | Status: AC
  Administered 2015-07-12: 11:00:00

## 2015-07-12 MED ORDER — GABAPENTIN 300 MG PO CAPS
300.0000 mg | ORAL_CAPSULE | Freq: Two times a day (BID) | ORAL | Status: DC
  Administered 2015-07-12: 12:00:00 300 mg via ORAL
  Filled 2015-07-12: qty 1

## 2015-07-12 MED ORDER — TIOTROPIUM BROMIDE MONOHYDRATE 18 MCG IN CAPS
1.0000 | ORAL_CAPSULE | Freq: Every day | RESPIRATORY_TRACT | Status: DC
  Administered 2015-07-12: 18 ug via RESPIRATORY_TRACT
  Filled 2015-07-12: qty 5

## 2015-07-12 MED ORDER — METHOCARBAMOL 500 MG PO TABS: 500.0000 mg | ORAL_TABLET | Freq: Three times a day (TID) | ORAL | Status: DC | PRN

## 2015-07-12 NOTE — ED Notes (Signed)
MD Manson PasseyBrown notified of pt's pain score

## 2015-07-12 NOTE — ED Notes (Signed)
Pt's wife reported he woke up since approx 9pm yesterday and patient was altered, drooling, and had nonsensical speech. Pt was unstable on feet.

## 2015-07-12 NOTE — ED Notes (Signed)
MD Brown at bedside.

## 2015-07-12 NOTE — ED Notes (Signed)
Pt's wife would like to be contacted for any change in pt status. [place] Anaracy [phone]

## 2015-07-12 NOTE — Progress Notes (Signed)
Agitated. States" I am going to leave!" Encouraged to discuss reason for wanting to leave. States" I do not need to be here and I am getting the hell out of here. Explained importance of need to stay. Refuses to stay. Dr. Imogene Burnhen notified of patients desire to leave AMA. Per MD let patient sign form. AMA paperwork provided to patient, signed and witnessed. IV removed. Discharged via wheelchair to lobby with family member at side. Belongings sent with family member.
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
## 2015-07-12 NOTE — ED Notes (Addendum)
Per EMS: Pt c/o altered mental status. Pt had 6-7 beers at 6pm on 3/30 (norm for patient), also took gabapentin, percocet and diazapam. Pt presents with slurred speech, and slowed movements.   PT has facial drooping of right side, with drift of the right leg. Pt has slurred speech, and slowed movements.   Per pt's wife pt also took methocarbamol, hydroxyzine, meloxicam, nadolol, and spiriva

## 2015-07-12 NOTE — Evaluation (Signed)
Physical Therapy Evaluation Patient Details Name: Kyle JakschFrank P Gomez MRN: [id number] DOB: 11/02/1960 Today's Date: 07/12/2015   History of Present Illness  Pt is here with slurred speech.  He apparently is speaking better than his baseline during eval, but is still very difficult to understand and was unable to stay on topic t/o the session.  He rambled about fleeting topics t/o the session and was difficult to understand the entire time.    Clinical Impression  Pt here with slurred speech.  He is still having some speech difficulties and is difficult to understand but his biggest issue appears to be his lack of cohesive though process and inability to follow appropriately with basic conversation and instruction.  He shows good strength, relative balance and is able to easily circumambulate the nurses' station w/o AD.  He does have some impulsiveness and needs constant directional and safety cuing to remain on task but is physically able to do all he needed.  Note: PT eval ordered to start tomorrow, spoke with referring MD who okayed seeing pt today.     Follow Up Recommendations No PT follow up    Equipment Recommendations       Recommendations for Other Services       Precautions / Restrictions Precautions Precautions: Fall Restrictions Weight Bearing Restrictions: No      Mobility  Bed Mobility Overal bed mobility: Independent             General bed mobility comments: Pt able to get to sitting EOB w/o issue  Transfers Overall transfer level: Independent Equipment used: None             General transfer comment: Pt able to rise to standing quickly and confidently with no LOBs.   Ambulation/Gait Ambulation/Gait assistance: Min guard Ambulation Distance (Feet): 200 Feet Assistive device: None       General Gait Details: Pt walks with good confidence, however he did have some slight stagger stepping or general lack of awareness issues.  He was talking the entire time  and did not have any overt LOBs, but overall was safe despite his mental status.   Stairs            Wheelchair Mobility    Modified Rankin (Stroke Patients Only)       Balance Overall balance assessment: Modified Independent (close supervision secondary to impulsiveness)                                           Pertinent Vitals/Pain Pain Assessment: No/denies pain    Home Living Family/patient expects to be discharged to:: Private residence Living Arrangements: Spouse/significant other   Type of Home: House Home Access: Stairs to enter   Enter[employer] CorporationEntrance Stairs-Number of Steps: 2          Prior Function Level of Independence: Independent               Hand Dominance        Extremity/Trunk Assessment   Upper Extremity Assessment: Overall WFL for tasks assessed           Lower Extremity Assessment: Overall WFL for tasks assessed         Communication   Communication: Expressive difficulties  Cognition Arousal/Alertness:  (distracted, confused) Behavior During Therapy: Restless;Impulsive Overall Cognitive Status: Impaired/Different from baseline  General Comments      Exercises        Assessment/Plan    PT Assessment Patent does not need any further PT services  PT Diagnosis Difficulty walking;Altered mental status   PT Problem List    PT Treatment Interventions     PT Goals (Current goals can be found in the Care Plan section) Acute Rehab PT Goals Patient Stated Goal: go home PT Goal Formulation: Patient unable to participate in goal setting    Frequency     Barriers to discharge        Co-evaluation               End of Session Equipment Utilized During Treatment: Gait belt Activity Tolerance: Patient tolerated treatment well Patient left: with bed alarm set;with call bell/phone within reach;with family/visitor present      Functional Assessment Tool Used: clinical  judgement Functional Limitation: Mobility: Walking and moving around Mobility: Walking and Moving Around Current Status ([id number]): At least 1 percent but less than 20 percent impaired, limited or restricted Mobility: Walking and Moving Around Goal Status [phone]): At least 1 percent but less than 20 percent impaired, limited or restricted Mobility: Walking and Moving Around Discharge Status [phone]): At least 1 percent but less than 20 percent impaired, limited or restricted    Time: 2956-2130 PT Time Calculation (min) (ACUTE ONLY): 15 min   Charges:   PT Evaluation $PT Eval Low Complexity: 1 Procedure     PT G Codes:   PT G-Codes **NOT FOR INPATIENT CLASS** Functional Assessment Tool Used: clinical judgement Functional Limitation: Mobility: Walking and moving around Mobility: Walking and Moving Around Current Status ([id number]): At least 1 percent but less than 20 percent impaired, limited or restricted Mobility: Walking and Moving Around Goal Status [phone]): At least 1 percent but less than 20 percent impaired, limited or restricted Mobility: Walking and Moving Around Discharge Status [phone]): At least 1 percent but less than 20 percent impaired, limited or restricted   Loran Senters, PT, DPT [phone]  Malachi Pro 07/12/2015, 3:22 PM

## 2015-07-12 NOTE — ED Notes (Signed)
Pt c/o of weakness
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
## 2015-07-12 NOTE — Discharge Summary (Signed)
[facility] [place] [facility]   PATIENT NAME: Kyle DineFrank Gomez    MR#:  [id number]  DATE OF BIRTH:  02/17/1961  DATE OF ADMISSION:  07/12/2015 ADMITTING PHYSICIAN: Shaune PollackQing Jasreet Dickie, MD  DATE OF DISCHARGE: 07/12/2015  2:30 PM  PRIMARY CARE PHYSICIAN: Edwena FeltyAshany Sundaram, MD    ADMISSION DIAGNOSIS:  Slurred speech [R47.81] Altered mental status [R41.82]   DISCHARGE DIAGNOSIS:   Slurred speech AMS, acute encephalopathy, possible due to substance abuse Alcohol abuse SECONDARY DIAGNOSIS:   Past Medical History  Diagnosis Date  . Alcoholism (HCC)   . GERD (gastroesophageal reflux disease)   . Thrombocytopenia (HCC)   . Thyroid disease   . Chronic hepatitis C without hepatic coma (HCC)   . Polysubstance abuse   . Cholelithiasis without cholecystitis   . Prostate disease   . Chronic pain disorder   . Rectal bleeding   . Eczema   . Hemorrhoid prolapse   . Macrocytosis   . Hypertension   . Depression   . Anxiety   . HLD (hyperlipidemia)   . Mental problems   . Bipolar disorder (HCC)   . BPH (benign prostatic hyperplasia)   . Erectile dysfunction   . Hypogonadism in male   . Hyperpituitarism (HCC)   . Allergy   . Cirrhosis of liver [facility](HCC) 2014    HOSPITAL COURSE:   Slurred speech Neuro check,  Planned echo, carotid duplex. Treated with Plavix, lipitor. Lipid panel. Allergic to ASA. PT and speech study done.  AMS, acute encephalopathy, possible due to substance abuse.  Alcohol abuse. CIWA.  Thrombocytopenia. No lovenox but SCD.  Substance abuse. Drug screen shows opiate and methadone.  DISCHARGE CONDITIONS:   Left AMA.  CONSULTS OBTAINED:  Treatment Team:  Kym GroomNeuro1 [facility], MD  DRUG ALLERGIES:   Allergies  Allergen Reactions  . Trazodone And Nefazodone Other (See Comments)    Patient reports having nightmares while taking this medication  . Aspirin   . Mango Flavor Hives  . Tylenol [Acetaminophen] Other (See Comments)    No Tylenol  d/t cirrhosis    DISCHARGE MEDICATIONS:   Discharge Medication List as of 07/12/2015  5:00 PM    CONTINUE these medications which have NOT CHANGED   Details  busPIRone (BUSPAR) 10 MG tablet Take 10 mg by mouth 3 (three) times daily., Starting 05/15/2015, Until Discontinued, Historical Med    diazepam (VALIUM) 5 MG tablet Take 5 mg by mouth 2 (two) times daily., Starting 06/17/2015, Until Discontinued, Historical Med    gabapentin (NEURONTIN) 300 MG capsule take 1 capsule by mouth three times a day, Historical Med    hydrOXYzine (VISTARIL) 25 MG capsule Take 1 capsule by mouth 3 (three) times daily as needed., Starting 05/29/2015, Until Discontinued, Historical Med    Levothyroxine Sodium (TIROSINT) 112 MCG CAPS Take 1 capsule (112 mcg total) by mouth daily before breakfast., Starting 02/27/2015, Until Discontinued, Normal    meloxicam (MOBIC) 7.5 MG tablet Take 7.5 mg by mouth 2 (two) times daily., Starting 05/29/2015, Until Discontinued, Historical Med    methocarbamol (ROBAXIN) 500 MG tablet Take 1 tablet by mouth 3 (three) times daily as needed., Starting 05/29/2015, Until Discontinued, Historical Med    mometasone (ELOCON) 0.1 % ointment Apply 1 application topically daily as needed., Starting 06/17/2015, Until Discontinued, Historical Med    nadolol (CORGARD) 40 MG tablet Take 40 mg by mouth daily. , Starting 09/04/2014, Until Discontinued, Historical Med    PROAIR HFA 108 (90 Base) MCG/ACT inhaler Inhale 1-2 puffs  into the lungs every 4 (four) hours as needed for wheezing or shortness of breath. , Starting 06/24/2015, Until Discontinued, Historical Med    QVAR 80 MCG/ACT inhaler Inhale 1 puff into the lungs daily. , Starting 05/16/2015, Until Discontinued, Historical Med    ranitidine (ZANTAC) 75 MG tablet Take 75 mg by mouth 2 (two) times daily as needed. , Until Discontinued, Historical Med    SPIRIVA HANDIHALER 18 MCG inhalation capsule Place 1 capsule into inhaler and inhale daily.,  Starting 05/29/2015, Until Discontinued, Historical Med    tamsulosin (FLOMAX) 0.4 MG CAPS capsule Take 0.4 mg by mouth daily. , Starting 09/04/2014, Until Discontinued, Historical Med         DISCHARGE INSTRUCTIONS:    If you experience worsening of your admission symptoms, develop shortness of breath, life threatening emergency, suicidal or homicidal thoughts you must seek medical attention immediately by calling 911 or calling your MD immediately  if symptoms less severe.  You Must read complete instructions/literature along with all the possible adverse reactions/side effects for all the Medicines you take and that have been prescribed to you. Take any new Medicines after you have completely understood and accept all the possible adverse reactions/side effects.   Please note  You were cared for by a hospitalist during your hospital stay. If you have any questions about your discharge medications or the care you received while you were in the hospital after you are discharged, you can call the unit and asked to speak with the hospitalist on call if the hospitalist that took care of you is not available. Once you are discharged, your primary care physician will handle any further medical issues. Please note that NO REFILLS for any discharge medications will be authorized once you are discharged, as it is imperative that you return to your primary care physician (or establish a relationship with a primary care physician if you do not have one) for your aftercare needs so that they can reassess your need for medications and monitor your lab values.    DATA REVIEW:   CBC  Recent Labs Lab 07/12/15 0352  WBC 3.9  HGB 14.3  HCT 41.2  PLT 55*    Chemistries   Recent Labs Lab 07/12/15 0352  NA 139  K 3.6  CL 109  CO2 26  GLUCOSE 108*  BUN 15  CREATININE 0.78  CALCIUM 8.3*    Cardiac Enzymes  Recent Labs Lab 07/12/15 0352  TROPONINI <0.03    Microbiology Results   Results for orders placed or performed in visit on 09/14/11  Culture, blood (single)     Status: None   Collection Time: 09/14/11 11:12 AM  Result Value Ref Range Status   Micro Text Report   Final       COMMENT                   NO GROWTH AEROBICALLY/ANAEROBICALLY IN 5 DAYS   ANTIBIOTIC                                                      Culture, blood (single)     Status: None   Collection Time: 09/14/11 11:12 AM  Result Value Ref Range Status   Micro Text Report   Final       COMMENT  NO GROWTH AEROBICALLY/ANAEROBICALLY IN 5 DAYS   ANTIBIOTIC                                                        RADIOLOGY:  Ct Head Wo Contrast  07/12/2015  CLINICAL DATA:  Acute onset of altered mental status. Slurred speech and slowed movements. Initial encounter. EXAM: CT HEAD WITHOUT CONTRAST TECHNIQUE: Contiguous axial images were obtained from the base of the skull through the vertex without intravenous contrast. COMPARISON:  CT of the head performed 02/09/2015 FINDINGS: There is no evidence of acute infarction, mass lesion, or intra- or extra-axial hemorrhage on CT. Prominence of the ventricles and sulci reflects mild cortical volume loss. Mild cerebellar atrophy is noted. The brainstem and fourth ventricle are within normal limits. The basal ganglia are unremarkable in appearance. The cerebral hemispheres demonstrate grossly normal gray-white differentiation. No mass effect or midline shift is seen. There is no evidence of fracture; visualized osseous structures are unremarkable in appearance. The orbits are within normal limits. The paranasal sinuses and mastoid air cells are well-aerated. No significant soft tissue abnormalities are seen. IMPRESSION: 1. No acute intracranial pathology seen on CT. 2. Mild cortical volume loss noted. Electronically Signed   By: Roanna Raider M.D.   On: 07/12/2015 04:29   Mr Brain Wo Contrast  07/12/2015  CLINICAL DATA:  Initial evaluation  for acute altered mental status, right facial droop. EXAM: MRI HEAD WITHOUT CONTRAST TECHNIQUE: Multiplanar, multiecho pulse sequences of the brain and surrounding structures were obtained without intravenous contrast. COMPARISON:  Prior CT from earlier the same day. FINDINGS: Study degraded by motion artifact. Age appropriate cerebral atrophy present. No significant white matter disease present. No abnormal foci of restricted diffusion to suggest acute infarct. Gray-white matter differentiation maintained. Major intracranial vascular flow voids preserved. Right vertebral artery not well visualized and is likely diminutive. No areas of chronic infarction. No acute or chronic intracranial hemorrhage. No mass lesion, midline shift, or mass effect. No hydrocephalus. No extra-axial fluid collection. Major dural sinuses are grossly patent. Craniocervical junction within normal limits. Visualized upper cervical spine unremarkable. Pituitary gland normal.  No acute abnormality about the orbits. Mild opacity within the left ethmoidal air cells. Paranasal sinuses are otherwise clear. No mastoid effusion. Inner ear structures grossly normal. Bone marrow signal intensity within normal limits. No scalp soft tissue abnormality. IMPRESSION: Normal brain MRI for patient age. No acute intracranial infarct or other abnormality identified. Electronically Signed   By: Rise Mu M.D.   On: 07/12/2015 06:04        Management plans discussed with the patient, family and they are in agreement.  CODE STATUS:     Code Status Orders        Start     Ordered   07/12/15 0937  Full code   Continuous     07/12/15 0936    Code Status History    Date Active Date Inactive Code Status Order ID Comments User Context   This patient has a current code status but no historical code status.
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
## 2015-07-12 NOTE — ED Notes (Signed)
Patient transported to MRI 

## 2015-10-21 ENCOUNTER — Emergency Department: Payer: Medicaid Other

## 2015-10-21 ENCOUNTER — Encounter: Payer: Self-pay | Admitting: Emergency Medicine

## 2015-10-21 ENCOUNTER — Emergency Department
Admission: EM | Admit: 2015-10-21 | Discharge: 2015-10-22 | Disposition: A | Discharge status: Home / Self Care | Payer: Medicaid Other | Attending: Emergency Medicine | Admitting: Emergency Medicine

## 2015-10-21 DIAGNOSIS — F332 Major depressive disorder, recurrent severe without psychotic features: Secondary | ICD-10-CM | POA: Insufficient documentation

## 2015-10-21 DIAGNOSIS — F10239 Alcohol dependence with withdrawal, unspecified: Secondary | ICD-10-CM

## 2015-10-21 DIAGNOSIS — E785 Hyperlipidemia, unspecified: Secondary | ICD-10-CM | POA: Diagnosis not present

## 2015-10-21 DIAGNOSIS — E039 Hypothyroidism, unspecified: Secondary | ICD-10-CM | POA: Diagnosis not present

## 2015-10-21 DIAGNOSIS — F10229 Alcohol dependence with intoxication, unspecified: Secondary | ICD-10-CM

## 2015-10-21 DIAGNOSIS — I10 Essential (primary) hypertension: Secondary | ICD-10-CM | POA: Insufficient documentation

## 2015-10-21 DIAGNOSIS — F1190 Opioid use, unspecified, uncomplicated: Secondary | ICD-10-CM | POA: Diagnosis not present

## 2015-10-21 DIAGNOSIS — F10220 Alcohol dependence with intoxication, uncomplicated: Secondary | ICD-10-CM | POA: Diagnosis not present

## 2015-10-21 DIAGNOSIS — Z791 Long term (current) use of non-steroidal anti-inflammatories (NSAID): Secondary | ICD-10-CM | POA: Diagnosis not present

## 2015-10-21 DIAGNOSIS — F17210 Nicotine dependence, cigarettes, uncomplicated: Secondary | ICD-10-CM | POA: Diagnosis not present

## 2015-10-21 DIAGNOSIS — R45851 Suicidal ideations: Secondary | ICD-10-CM | POA: Diagnosis present

## 2015-10-21 DIAGNOSIS — F1010 Alcohol abuse, uncomplicated: Secondary | ICD-10-CM

## 2015-10-21 DIAGNOSIS — F10939 Alcohol use, unspecified with withdrawal, unspecified: Secondary | ICD-10-CM

## 2015-10-21 DIAGNOSIS — F10230 Alcohol dependence with withdrawal, uncomplicated: Secondary | ICD-10-CM | POA: Diagnosis not present

## 2015-10-21 DIAGNOSIS — Z79899 Other long term (current) drug therapy: Secondary | ICD-10-CM | POA: Diagnosis not present

## 2015-10-21 LAB — COMPREHENSIVE METABOLIC PANEL
ALK PHOS: 83 U/L (ref 38–126)
ALT: 36 U/L (ref 17–63)
ANION GAP: 11 (ref 5–15)
AST: 76 U/L — ABNORMAL HIGH (ref 15–41)
Albumin: 4 g/dL (ref 3.5–5.0)
BUN: 10 mg/dL (ref 6–20)
CALCIUM: 9.1 mg/dL (ref 8.9–10.3)
CHLORIDE: 103 mmol/L (ref 101–111)
CO2: 23 mmol/L (ref 22–32)
CREATININE: 0.65 mg/dL (ref 0.61–1.24)
Glucose, Bld: 159 mg/dL — ABNORMAL HIGH (ref 65–99)
Potassium: 3.5 mmol/L (ref 3.5–5.1)
SODIUM: 137 mmol/L (ref 135–145)
TOTAL PROTEIN: 7.9 g/dL (ref 6.5–8.1)
Total Bilirubin: 1.4 mg/dL — ABNORMAL HIGH (ref 0.3–1.2)

## 2015-10-21 LAB — CBC
HCT: 46 % (ref 40.0–52.0)
Hemoglobin: 16.1 g/dL (ref 13.0–18.0)
MCH: 36.6 pg — AB (ref 26.0–34.0)
MCHC: 35.1 g/dL (ref 32.0–36.0)
MCV: 104.4 fL — ABNORMAL HIGH (ref 80.0–100.0)
Platelets: 68 10*3/uL — ABNORMAL LOW (ref 150–440)
RBC: 4.41 MIL/uL (ref 4.40–5.90)
RDW: 13 % (ref 11.5–14.5)
WBC: 7.1 10*3/uL (ref 3.8–10.6)

## 2015-10-21 LAB — URINE DRUG SCREEN, QUALITATIVE (ARMC ONLY)
Amphetamines, Ur Screen: NOT DETECTED
BARBITURATES, UR SCREEN: NOT DETECTED
BENZODIAZEPINE, UR SCRN: NOT DETECTED
CANNABINOID 50 NG, UR ~~LOC~~: NOT DETECTED
COCAINE METABOLITE, UR ~~LOC~~: NOT DETECTED
MDMA (Ecstasy)Ur Screen: NOT DETECTED
METHADONE SCREEN, URINE: NOT DETECTED
Opiate, Ur Screen: NOT DETECTED
Phencyclidine (PCP) Ur S: NOT DETECTED
TRICYCLIC, UR SCREEN: NOT DETECTED

## 2015-10-21 LAB — TYPE AND SCREEN
ABO/RH(D): A POS
ANTIBODY SCREEN: NEGATIVE

## 2015-10-21 LAB — LIPASE, BLOOD: Lipase: 44 U/L (ref 11–51)

## 2015-10-21 LAB — TSH: TSH: 0.884 u[IU]/mL (ref 0.350–4.500)

## 2015-10-21 LAB — ETHANOL: ALCOHOL ETHYL (B): 129 mg/dL — AB (ref <5)

## 2015-10-21 MED ORDER — LORAZEPAM 2 MG/ML IJ SOLN: 0.0000 mg | Freq: Two times a day (BID) | INTRAMUSCULAR | Status: DC

## 2015-10-21 MED ORDER — LORAZEPAM 2 MG/ML IJ SOLN
0.0000 mg | Freq: Four times a day (QID) | INTRAMUSCULAR | Status: DC
  Administered 2015-10-21: 2 mg via INTRAVENOUS
  Filled 2015-10-21: qty 1

## 2015-10-21 MED ORDER — LORAZEPAM 2 MG/ML IJ SOLN
1.0000 mg | Freq: Once | INTRAMUSCULAR | Status: AC
  Administered 2015-10-21: 1 mg via INTRAVENOUS

## 2015-10-21 MED ORDER — LORAZEPAM 2 MG PO TABS: 0.0000 mg | ORAL_TABLET | Freq: Two times a day (BID) | ORAL | Status: DC

## 2015-10-21 MED ORDER — LORAZEPAM 2 MG PO TABS
0.0000 mg | ORAL_TABLET | Freq: Four times a day (QID) | ORAL | Status: DC
  Administered 2015-10-21 – 2015-10-22 (×5): 2 mg via ORAL
  Filled 2015-10-21 (×6): qty 1

## 2015-10-21 MED ORDER — PROMETHAZINE HCL 25 MG PO TABS
25.0000 mg | ORAL_TABLET | Freq: Once | ORAL | Status: AC
  Administered 2015-10-21: 25 mg via ORAL
  Filled 2015-10-21: qty 1

## 2015-10-21 MED ORDER — LORAZEPAM 2 MG/ML IJ SOLN
1.0000 mg | Freq: Once | INTRAMUSCULAR | Status: AC
  Administered 2015-10-21: 1 mg via INTRAVENOUS
  Filled 2015-10-21: qty 1

## 2015-10-21 MED ORDER — LORAZEPAM 2 MG/ML IJ SOLN
INTRAMUSCULAR | Status: AC
  Administered 2015-10-21: 1 mg via INTRAVENOUS
  Filled 2015-10-21: qty 1

## 2015-10-21 MED ORDER — LORAZEPAM 2 MG PO TABS: 2.0000 mg | ORAL_TABLET | Freq: Once | ORAL | Status: DC
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
## 2015-10-21 NOTE — ED Notes (Signed)
Pt refuses to allow this RN to place an NG tube for NG lavage, pt states "I don't need it". MD made aware.

## 2015-10-21 NOTE — ED Notes (Addendum)
Attempted to dress patient out. Pt. Refused to changed out of underwear and socks. Refused to give hat and glasses. Notified the RN Augusto GambleJerri.

## 2015-10-21 NOTE — ED Notes (Signed)
Patient presents to the ED for detox.  Patient states, "I have pain in my stomach and my legs and so I drink".  Patient states, "I want to stop drinking but it's really hard."  Patient reports drinking 2 beers today and "a little boot legger".  Patient's wife states the patient's doctor has been telling him to go to the pain clinic but, "they keep changing appointments on him so it's been months that he hasn't gotten anything for his pain.  Patient has history of cirrhosis of the liver and thyroid problems.

## 2015-10-21 NOTE — ED Notes (Signed)
Patient also reports nausea, vomiting and diarrhea since yesterday.  Patient's wife reports there has been bright red blood in patient's stool and emesis.
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
## 2015-10-21 NOTE — ED Notes (Signed)
Spouse called to check on pt, stated her number is in the chart and if anything changes please call to follow up.

## 2015-10-22 DIAGNOSIS — F10939 Alcohol use, unspecified with withdrawal, unspecified: Secondary | ICD-10-CM

## 2015-10-22 DIAGNOSIS — F10230 Alcohol dependence with withdrawal, uncomplicated: Secondary | ICD-10-CM

## 2015-10-22 DIAGNOSIS — F1010 Alcohol abuse, uncomplicated: Secondary | ICD-10-CM

## 2015-10-22 DIAGNOSIS — F10239 Alcohol dependence with withdrawal, unspecified: Secondary | ICD-10-CM

## 2015-10-22 MED ORDER — ALBUTEROL SULFATE HFA 108 (90 BASE) MCG/ACT IN AERS
1.0000 | INHALATION_SPRAY | RESPIRATORY_TRACT | Status: DC | PRN
Start: 2015-10-22 — End: 2015-10-22
  Filled 2015-10-22: qty 6.7

## 2015-10-22 MED ORDER — GABAPENTIN 300 MG PO CAPS
300.0000 mg | ORAL_CAPSULE | Freq: Three times a day (TID) | ORAL | Status: DC
  Administered 2015-10-22: 300 mg via ORAL
  Filled 2015-10-22: qty 1

## 2015-10-22 MED ORDER — FAMOTIDINE 20 MG PO TABS: 10.0000 mg | ORAL_TABLET | Freq: Every day | ORAL | Status: DC

## 2015-10-22 MED ORDER — NADOLOL 40 MG PO TABS
40.0000 mg | ORAL_TABLET | Freq: Every day | ORAL | Status: DC
  Administered 2015-10-22: 40 mg via ORAL
  Filled 2015-10-22: qty 1

## 2015-10-22 MED ORDER — LOPERAMIDE HCL 2 MG PO CAPS
2.0000 mg | ORAL_CAPSULE | Freq: Once | ORAL | Status: AC
  Administered 2015-10-22: 2 mg via ORAL
  Filled 2015-10-22: qty 1

## 2015-10-22 MED ORDER — LOPERAMIDE HCL 2 MG PO CAPS
ORAL_CAPSULE | ORAL | Status: AC
  Administered 2015-10-22: 2 mg via ORAL
  Filled 2015-10-22: qty 1

## 2015-10-22 MED ORDER — BUSPIRONE HCL 10 MG PO TABS
10.0000 mg | ORAL_TABLET | Freq: Three times a day (TID) | ORAL | Status: DC
  Administered 2015-10-22: 10 mg via ORAL
  Filled 2015-10-22: qty 1

## 2015-10-22 MED ORDER — LORAZEPAM 2 MG PO TABS
2.0000 mg | ORAL_TABLET | Freq: Once | ORAL | Status: AC
  Administered 2015-10-22: 2 mg via ORAL

## 2015-10-22 MED ORDER — HYDROXYZINE PAMOATE 25 MG PO CAPS
25.0000 mg | ORAL_CAPSULE | Freq: Three times a day (TID) | ORAL | Status: DC | PRN
  Administered 2015-10-22: 25 mg via ORAL
  Filled 2015-10-22: qty 1

## 2015-10-22 MED ORDER — LORAZEPAM 2 MG PO TABS
2.0000 mg | ORAL_TABLET | Freq: Once | ORAL | Status: AC
  Administered 2015-10-22: 2 mg via ORAL
  Filled 2015-10-22: qty 1

## 2015-10-22 NOTE — ED Notes (Signed)
Patient is talking with Dr. Toni Amendlapacs. Patient ask for a shower again, and nurse let him know that He had already had a shower today.

## 2015-10-22 NOTE — ED Notes (Signed)
Pt discharged with no issues.

## 2015-10-22 NOTE — Discharge Instructions (Signed)
You have been seen in the Emergency Department (ED) today for alcohol intoxication.  As we have discussed, please do NOT drink alcohol in excess as alcohol intoxication can be life threatening. NEVER drive or operate machinery after drinking alcohol.    Do NOT drive today.  Drink plenty of water or other clear liquids (Gatorade, Powerade, etc) for the next 24 hours to help your body recover and stay hydrated.  Follow-up care is a key part of your treatment and safety. Follow up with your doctor in the next 2-5 days. If you need help to stop drinking please call your primary care doctor  How can you care for yourself at home?  Be safe with medicines. Take your medicines exactly as prescribed. Call your doctor if you think you are having a problem with your medicine.  Your doctor may have prescribed disulfiram (Antabuse). Do not drink any alcohol while you are taking this medicine. You may have severe or even life-threatening side effects from even small amounts of alcohol.  If you were given medicine to prevent nausea, be sure to take it exactly as prescribed.  Before you take any medicine, tell your doctor if:  You have had a bad reaction to any medicines in the past.  You are taking other medicines, including over-the-counter ones, or have other health problems.  You are or could be pregnant. Be prepared to have some symptoms of withdrawal in the next few days which includes agitation, tremors, anxiety, sweating, headache, nausea, vomiting. Drink plenty of liquids in the next few days to prevent dehydration. Seek help if you need it to stop drinking. Getting counseling and joining a support group can help you stay sober. Try a support group such as Alcoholics Anonymous.  Avoid alcohol when you take medicines. It can react with many medicines and cause serious problems.  When should you call for help?  Call 911 anytime you think you may need emergency care. For example, call if:  You feel  confused and are seeing things that are not there.  You are thinking about killing yourself or hurting others.  You have a seizure.  You vomit blood or what looks like coffee grounds.  Call your doctor now or seek immediate medical care if:  You have trembling, restlessness, sweating, and other withdrawal symptoms that are new or that get worse.  Your withdrawal symptoms come back after not bothering you for days or weeks.  You can't stop vomiting.  Watch closely for changes in your health, and be sure to contact your doctor if:  You need help to stop drinking.

## 2015-10-22 NOTE — ED Notes (Signed)
Patient is anxious, states my skin is crawling, Patient restless, and pacing, nurse received ordered for po ativan. Nurse administered 2 mg as ordered. Patient is safe, q 15 min. Checks.

## 2015-10-22 NOTE — ED Notes (Signed)
Patient taking shower. Gave patient deodorant and He states that He does not like that kind Nurse let him know that is the only kind we have. .Marland Kitchen

## 2015-10-22 NOTE — ED Provider Notes (Signed)
-----------------------------------------   7:35 AM on 10/22/2015 -----------------------------------------   Blood pressure 160/125, pulse 82, temperature 98.3 F (36.8 C), temperature source Oral, resp. rate 18, height 6\' 1"  (1.854 m), weight 240 lb (108.863 kg), SpO2 97 %.  The patient had no acute events since last update.  Calm and cooperative at this time.  Disposition is pending per Psychiatry/Behavioral Medicine team recommendations.     Rebecka ApleyAllison P Webster, MD 10/22/15 [id number]

## 2015-10-22 NOTE — ED Notes (Signed)
Patient is restless, and paces, His cover from the bed is all over the room, patient states that he feels horrible and having anxiety from alcohol withdrawal, patient has had diarrhea also, nurse obtained order for ativan and lomotil from Dr. Cyril LoosenKinner.  Patient took without dificulty., q 15 min. Checks. Done. Per nurse.
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
## 2015-10-22 NOTE — ED Notes (Signed)
Patient is disorganized, and restless, He is pacing on and off, remains on ciwa, patient denies Si/HI or avh, patient did talk about His liver problems, but states that He is going to always drink, patient states the only time He has been sober is when He is locked up somewhere and cannot get to alcohol, and the longest time He has been sober is 1 to 2 hours since He was 55 years old. Patient does follow commands and is cooperative. q 15 min. Checks and camera monitoring in progress.

## 2015-10-22 NOTE — ED Notes (Signed)
Patient has been up all night.  Patient had incontinent episode of stool in his room.  Patient stated, "I just couldn't make it to the bathroom.  I feel so bad."

## 2015-10-22 NOTE — ED Notes (Signed)
Patient is sitting in the dayroom, He is talking to other patients, patient is cooperative, but remains restless. Nurse will continue to monitor.

## 2015-10-22 NOTE — ED Notes (Signed)
Nurse talked to Dr. Toni Amendlapacs about wife's concern about husband having change in speech and mental status, and B/p being elevated. Staff will continue to monitor.

## 2015-10-22 NOTE — ED Provider Notes (Signed)
Patient evaluated and cleared by psychiatry for discharge home. Psychiatry offered placement in for detox however patient has refused.  Nita Sicklearolina Hondo Nanda, MD 10/22/15 1931

## 2015-10-22 NOTE — ED Notes (Signed)
Patient alert and oriented x 4. Patient denies SI/HI/AVH. Patient aware of discharge. Patient's ride is waiting in lobby.

## 2015-10-22 NOTE — ED Notes (Signed)
Patient wife Flossie Dibbleracy Narula [phone]) called stating that when she went to visit the patient this afternoon he seemed quite disoriented and and had slurred speech. She said that he was last clear and coherent was yesterday afternoon. Nurse informed wife that this information would be relayed to the psychiatrist.

## 2016-01-01 ENCOUNTER — Emergency Department: Payer: Medicaid Other

## 2016-01-01 ENCOUNTER — Inpatient Hospital Stay
Admission: EM | Admit: 2016-01-01 | Discharge: 2016-01-12 | DRG: 296 | Disposition: E | Payer: Medicaid Other | Attending: Internal Medicine | Admitting: Internal Medicine

## 2016-01-01 DIAGNOSIS — R7989 Other specified abnormal findings of blood chemistry: Secondary | ICD-10-CM

## 2016-01-01 DIAGNOSIS — J449 Chronic obstructive pulmonary disease, unspecified: Secondary | ICD-10-CM | POA: Diagnosis present

## 2016-01-01 DIAGNOSIS — Z79899 Other long term (current) drug therapy: Secondary | ICD-10-CM

## 2016-01-01 DIAGNOSIS — Z515 Encounter for palliative care: Secondary | ICD-10-CM | POA: Diagnosis not present

## 2016-01-01 DIAGNOSIS — R34 Anuria and oliguria: Secondary | ICD-10-CM | POA: Diagnosis present

## 2016-01-01 DIAGNOSIS — R778 Other specified abnormalities of plasma proteins: Secondary | ICD-10-CM | POA: Diagnosis present

## 2016-01-01 DIAGNOSIS — L309 Dermatitis, unspecified: Secondary | ICD-10-CM | POA: Diagnosis present

## 2016-01-01 DIAGNOSIS — I469 Cardiac arrest, cause unspecified: Principal | ICD-10-CM | POA: Diagnosis present

## 2016-01-01 DIAGNOSIS — F1120 Opioid dependence, uncomplicated: Secondary | ICD-10-CM | POA: Diagnosis present

## 2016-01-01 DIAGNOSIS — G931 Anoxic brain damage, not elsewhere classified: Secondary | ICD-10-CM

## 2016-01-01 DIAGNOSIS — K219 Gastro-esophageal reflux disease without esophagitis: Secondary | ICD-10-CM | POA: Diagnosis present

## 2016-01-01 DIAGNOSIS — Z66 Do not resuscitate: Secondary | ICD-10-CM | POA: Diagnosis not present

## 2016-01-01 DIAGNOSIS — E875 Hyperkalemia: Secondary | ICD-10-CM | POA: Diagnosis present

## 2016-01-01 DIAGNOSIS — F17210 Nicotine dependence, cigarettes, uncomplicated: Secondary | ICD-10-CM | POA: Diagnosis present

## 2016-01-01 DIAGNOSIS — E785 Hyperlipidemia, unspecified: Secondary | ICD-10-CM | POA: Diagnosis present

## 2016-01-01 DIAGNOSIS — G9341 Metabolic encephalopathy: Secondary | ICD-10-CM | POA: Diagnosis present

## 2016-01-01 DIAGNOSIS — N179 Acute kidney failure, unspecified: Secondary | ICD-10-CM

## 2016-01-01 DIAGNOSIS — Z0189 Encounter for other specified special examinations: Secondary | ICD-10-CM

## 2016-01-01 DIAGNOSIS — F1020 Alcohol dependence, uncomplicated: Secondary | ICD-10-CM | POA: Diagnosis present

## 2016-01-01 DIAGNOSIS — D72829 Elevated white blood cell count, unspecified: Secondary | ICD-10-CM | POA: Diagnosis present

## 2016-01-01 DIAGNOSIS — Z791 Long term (current) use of non-steroidal anti-inflammatories (NSAID): Secondary | ICD-10-CM

## 2016-01-01 DIAGNOSIS — E872 Acidosis, unspecified: Secondary | ICD-10-CM

## 2016-01-01 DIAGNOSIS — B182 Chronic viral hepatitis C: Secondary | ICD-10-CM | POA: Diagnosis present

## 2016-01-01 DIAGNOSIS — N400 Enlarged prostate without lower urinary tract symptoms: Secondary | ICD-10-CM | POA: Diagnosis present

## 2016-01-01 DIAGNOSIS — N529 Male erectile dysfunction, unspecified: Secondary | ICD-10-CM | POA: Diagnosis present

## 2016-01-01 DIAGNOSIS — D696 Thrombocytopenia, unspecified: Secondary | ICD-10-CM | POA: Diagnosis present

## 2016-01-01 DIAGNOSIS — R402432 Glasgow coma scale score 3-8, at arrival to emergency department: Secondary | ICD-10-CM | POA: Diagnosis present

## 2016-01-01 DIAGNOSIS — G936 Cerebral edema: Secondary | ICD-10-CM | POA: Diagnosis present

## 2016-01-01 DIAGNOSIS — F319 Bipolar disorder, unspecified: Secondary | ICD-10-CM | POA: Diagnosis present

## 2016-01-01 DIAGNOSIS — K7030 Alcoholic cirrhosis of liver without ascites: Secondary | ICD-10-CM

## 2016-01-01 DIAGNOSIS — F419 Anxiety disorder, unspecified: Secondary | ICD-10-CM | POA: Diagnosis present

## 2016-01-01 DIAGNOSIS — G894 Chronic pain syndrome: Secondary | ICD-10-CM | POA: Diagnosis present

## 2016-01-01 DIAGNOSIS — I10 Essential (primary) hypertension: Secondary | ICD-10-CM

## 2016-01-01 DIAGNOSIS — E039 Hypothyroidism, unspecified: Secondary | ICD-10-CM | POA: Diagnosis present

## 2016-01-01 DIAGNOSIS — J9600 Acute respiratory failure, unspecified whether with hypoxia or hypercapnia: Secondary | ICD-10-CM | POA: Diagnosis present

## 2016-01-01 DIAGNOSIS — K7460 Unspecified cirrhosis of liver: Secondary | ICD-10-CM | POA: Diagnosis present

## 2016-01-01 LAB — BLOOD GAS, ARTERIAL
Acid-base deficit: 8.3 mmol/L — ABNORMAL HIGH (ref 0.0–2.0)
Bicarbonate: 16.5 mmol/L — ABNORMAL LOW (ref 20.0–28.0)
FIO2: 1
MECHANICAL RATE: 10
MECHVT: 500 mL
O2 SAT: 100 %
PATIENT TEMPERATURE: 37
PCO2 ART: 32 mmHg (ref 32.0–48.0)
PEEP: 5 cmH2O
PH ART: 7.32 — AB (ref 7.350–7.450)
RATE: 10 resp/min
pO2, Arterial: 505 mmHg — ABNORMAL HIGH (ref 83.0–108.0)

## 2016-01-01 LAB — COMPREHENSIVE METABOLIC PANEL
ALBUMIN: 3.6 g/dL (ref 3.5–5.0)
ALT: 61 U/L (ref 17–63)
AST: 117 U/L — AB (ref 15–41)
Alkaline Phosphatase: 109 U/L (ref 38–126)
Anion gap: 7 (ref 5–15)
BUN: 11 mg/dL (ref 6–20)
CHLORIDE: 103 mmol/L (ref 101–111)
CO2: 28 mmol/L (ref 22–32)
CREATININE: 2.01 mg/dL — AB (ref 0.61–1.24)
Calcium: 8.5 mg/dL — ABNORMAL LOW (ref 8.9–10.3)
GFR calc Af Amer: 42 mL/min — ABNORMAL LOW (ref >60)
GFR, EST NON AFRICAN AMERICAN: 36 mL/min — AB (ref >60)
GLUCOSE: 285 mg/dL — AB (ref 65–99)
POTASSIUM: 5.5 mmol/L — AB (ref 3.5–5.1)
Sodium: 138 mmol/L (ref 135–145)
Total Bilirubin: 1.4 mg/dL — ABNORMAL HIGH (ref 0.3–1.2)
Total Protein: 7.7 g/dL (ref 6.5–8.1)

## 2016-01-01 LAB — LACTIC ACID, PLASMA: LACTIC ACID, VENOUS: 5 mmol/L — AB (ref 0.5–1.9)

## 2016-01-01 LAB — ETHANOL

## 2016-01-01 LAB — TROPONIN I: TROPONIN I: 0.04 ng/mL — AB (ref <0.03)

## 2016-01-01 MED ORDER — SODIUM CHLORIDE 0.9 % IV SOLN
Freq: Once | INTRAVENOUS | Status: AC
  Administered 2016-01-01: 23:00:00 via INTRAVENOUS

## 2016-01-01 MED ORDER — NALOXONE HCL 2 MG/2ML IJ SOSY: 2.0000 mg | PREFILLED_SYRINGE | Freq: Once | INTRAMUSCULAR | Status: DC
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
## 2016-01-01 NOTE — ED Notes (Signed)
Mayford KnifeWilliams, MD made aware of critical Troponin and Lactic levels, as reported by lab.

## 2016-01-01 NOTE — ED Triage Notes (Addendum)
Pt arrived to ED via [facility] from home d/t unwitnessed cardiac arrest. EMS states pt's wife said they were having an argument, she said she was going to take a bath, and the pt said he was going out to get some Heroin. Wife states pt has been c/o CP all week, and after her bath she found him unresponsive and began CPR until EMS arrived. EMS reports pt asystolic upon arrival; [facility] EMS reports giving 3 doses of Epi, 1 amp of D50, and 2mg  of Narcan en route. EMS reports ROSC following 3rd round of Epi. Dr Mayford KnifeWilliams at bedside upon pt's arrival to ED Rm 7 at 2237 pm. EMS also reports pt was estimated to be down for 15 minutes before their arrival. EMS states pupils were 7mm and un-reactive upon their arrival.

## 2016-01-01 NOTE — ED Notes (Signed)
ETT placed by EDP and RT; 7 1/2 French, 24 at lip, (+) color change.

## 2016-01-02 ENCOUNTER — Inpatient Hospital Stay: Payer: Medicaid Other

## 2016-01-02 ENCOUNTER — Inpatient Hospital Stay (HOSPITAL_COMMUNITY): Payer: Medicaid Other

## 2016-01-02 ENCOUNTER — Inpatient Hospital Stay (HOSPITAL_COMMUNITY)
Admission: EM | Admit: 2016-01-02 | Discharge: 2016-01-02 | Disposition: A | Discharge status: Home / Self Care | Payer: Medicaid Other | Source: Home / Self Care | Attending: Pulmonary Disease | Admitting: Pulmonary Disease

## 2016-01-02 DIAGNOSIS — K7030 Alcoholic cirrhosis of liver without ascites: Secondary | ICD-10-CM

## 2016-01-02 DIAGNOSIS — E872 Acidosis, unspecified: Secondary | ICD-10-CM

## 2016-01-02 DIAGNOSIS — D696 Thrombocytopenia, unspecified: Secondary | ICD-10-CM

## 2016-01-02 DIAGNOSIS — L309 Dermatitis, unspecified: Secondary | ICD-10-CM | POA: Diagnosis present

## 2016-01-02 DIAGNOSIS — R7989 Other specified abnormal findings of blood chemistry: Secondary | ICD-10-CM

## 2016-01-02 DIAGNOSIS — F1120 Opioid dependence, uncomplicated: Secondary | ICD-10-CM

## 2016-01-02 DIAGNOSIS — F1010 Alcohol abuse, uncomplicated: Secondary | ICD-10-CM | POA: Diagnosis not present

## 2016-01-02 DIAGNOSIS — Z515 Encounter for palliative care: Secondary | ICD-10-CM | POA: Diagnosis not present

## 2016-01-02 DIAGNOSIS — N179 Acute kidney failure, unspecified: Secondary | ICD-10-CM | POA: Diagnosis present

## 2016-01-02 DIAGNOSIS — F1020 Alcohol dependence, uncomplicated: Secondary | ICD-10-CM | POA: Diagnosis present

## 2016-01-02 DIAGNOSIS — F1920 Other psychoactive substance dependence, uncomplicated: Secondary | ICD-10-CM

## 2016-01-02 DIAGNOSIS — E876 Hypokalemia: Secondary | ICD-10-CM

## 2016-01-02 DIAGNOSIS — R34 Anuria and oliguria: Secondary | ICD-10-CM | POA: Diagnosis present

## 2016-01-02 DIAGNOSIS — I469 Cardiac arrest, cause unspecified: Secondary | ICD-10-CM

## 2016-01-02 DIAGNOSIS — G894 Chronic pain syndrome: Secondary | ICD-10-CM | POA: Diagnosis present

## 2016-01-02 DIAGNOSIS — I10 Essential (primary) hypertension: Secondary | ICD-10-CM

## 2016-01-02 DIAGNOSIS — E875 Hyperkalemia: Secondary | ICD-10-CM

## 2016-01-02 DIAGNOSIS — D72829 Elevated white blood cell count, unspecified: Secondary | ICD-10-CM | POA: Diagnosis present

## 2016-01-02 DIAGNOSIS — R778 Other specified abnormalities of plasma proteins: Secondary | ICD-10-CM

## 2016-01-02 DIAGNOSIS — F319 Bipolar disorder, unspecified: Secondary | ICD-10-CM | POA: Diagnosis present

## 2016-01-02 DIAGNOSIS — F1910 Other psychoactive substance abuse, uncomplicated: Secondary | ICD-10-CM

## 2016-01-02 DIAGNOSIS — J449 Chronic obstructive pulmonary disease, unspecified: Secondary | ICD-10-CM | POA: Diagnosis present

## 2016-01-02 DIAGNOSIS — G936 Cerebral edema: Secondary | ICD-10-CM | POA: Diagnosis present

## 2016-01-02 DIAGNOSIS — K7460 Unspecified cirrhosis of liver: Secondary | ICD-10-CM

## 2016-01-02 DIAGNOSIS — F17210 Nicotine dependence, cigarettes, uncomplicated: Secondary | ICD-10-CM | POA: Diagnosis present

## 2016-01-02 DIAGNOSIS — G9341 Metabolic encephalopathy: Secondary | ICD-10-CM | POA: Diagnosis present

## 2016-01-02 DIAGNOSIS — Z66 Do not resuscitate: Secondary | ICD-10-CM | POA: Diagnosis not present

## 2016-01-02 DIAGNOSIS — N529 Male erectile dysfunction, unspecified: Secondary | ICD-10-CM | POA: Diagnosis present

## 2016-01-02 DIAGNOSIS — K219 Gastro-esophageal reflux disease without esophagitis: Secondary | ICD-10-CM | POA: Diagnosis present

## 2016-01-02 DIAGNOSIS — J9600 Acute respiratory failure, unspecified whether with hypoxia or hypercapnia: Secondary | ICD-10-CM | POA: Diagnosis present

## 2016-01-02 DIAGNOSIS — E039 Hypothyroidism, unspecified: Secondary | ICD-10-CM | POA: Diagnosis present

## 2016-01-02 DIAGNOSIS — G931 Anoxic brain damage, not elsewhere classified: Secondary | ICD-10-CM | POA: Diagnosis present

## 2016-01-02 LAB — MAGNESIUM
MAGNESIUM: 1.6 mg/dL — AB (ref 1.7–2.4)
Magnesium: 2.4 mg/dL (ref 1.7–2.4)
Magnesium: 2.3 mg/dL (ref 1.7–2.4)

## 2016-01-02 LAB — URINE DRUG SCREEN, QUALITATIVE (ARMC ONLY)
AMPHETAMINES, UR SCREEN: NOT DETECTED
Barbiturates, Ur Screen: NOT DETECTED
Benzodiazepine, Ur Scrn: POSITIVE — AB
COCAINE METABOLITE, UR ~~LOC~~: NOT DETECTED
Cannabinoid 50 Ng, Ur ~~LOC~~: NOT DETECTED
MDMA (ECSTASY) UR SCREEN: NOT DETECTED
Methadone Scn, Ur: NOT DETECTED
OPIATE, UR SCREEN: POSITIVE — AB
PHENCYCLIDINE (PCP) UR S: NOT DETECTED
Tricyclic, Ur Screen: NOT DETECTED

## 2016-01-02 LAB — CBC
HCT: 45.7 % (ref 40.0–52.0)
HEMATOCRIT: 48 % (ref 40.0–52.0)
HEMOGLOBIN: 16.2 g/dL (ref 13.0–18.0)
HEMOGLOBIN: 15.3 g/dL (ref 13.0–18.0)
MCH: 37.4 pg — AB (ref 26.0–34.0)
MCH: 36.9 pg — ABNORMAL HIGH (ref 26.0–34.0)
MCHC: 33.7 g/dL (ref 32.0–36.0)
MCHC: 33.5 g/dL (ref 32.0–36.0)
MCV: 111 fL — ABNORMAL HIGH (ref 80.0–100.0)
MCV: 110.1 fL — ABNORMAL HIGH (ref 80.0–100.0)
Platelets: 75 10*3/uL — ABNORMAL LOW (ref 150–440)
Platelets: 62 10*3/uL — ABNORMAL LOW (ref 150–440)
RBC: 4.33 MIL/uL — AB (ref 4.40–5.90)
RBC: 4.15 MIL/uL — ABNORMAL LOW (ref 4.40–5.90)
RDW: 14.2 % (ref 11.5–14.5)
RDW: 14.1 % (ref 11.5–14.5)
WBC: 14.7 10*3/uL — ABNORMAL HIGH (ref 3.8–10.6)
WBC: 15.4 10*3/uL — AB (ref 3.8–10.6)

## 2016-01-02 LAB — BASIC METABOLIC PANEL
Anion gap: 4 — ABNORMAL LOW (ref 5–15)
Anion gap: 7 (ref 5–15)
Anion gap: 5 (ref 5–15)
BUN: 14 mg/dL (ref 6–20)
BUN: 13 mg/dL (ref 6–20)
BUN: 19 mg/dL (ref 6–20)
CHLORIDE: 107 mmol/L (ref 101–111)
CHLORIDE: 112 mmol/L — AB (ref 101–111)
CHLORIDE: 109 mmol/L (ref 101–111)
CO2: 28 mmol/L (ref 22–32)
CO2: 22 mmol/L (ref 22–32)
CO2: 23 mmol/L (ref 22–32)
CREATININE: 1.34 mg/dL — AB (ref 0.61–1.24)
Calcium: 7.8 mg/dL — ABNORMAL LOW (ref 8.9–10.3)
Calcium: 7.2 mg/dL — ABNORMAL LOW (ref 8.9–10.3)
Calcium: 7.8 mg/dL — ABNORMAL LOW (ref 8.9–10.3)
Creatinine, Ser: 1.95 mg/dL — ABNORMAL HIGH (ref 0.61–1.24)
Creatinine, Ser: 1.17 mg/dL (ref 0.61–1.24)
GFR calc Af Amer: 43 mL/min — ABNORMAL LOW (ref >60)
GFR calc non Af Amer: 37 mL/min — ABNORMAL LOW (ref >60)
GFR calc non Af Amer: 59 mL/min — ABNORMAL LOW (ref >60)
GFR calc non Af Amer: >60 mL/min (ref >60)
GLUCOSE: 190 mg/dL — AB (ref 65–99)
GLUCOSE: 190 mg/dL — AB (ref 65–99)
Glucose, Bld: 120 mg/dL — ABNORMAL HIGH (ref 65–99)
POTASSIUM: 6.5 mmol/L — AB (ref 3.5–5.1)
POTASSIUM: 3.9 mmol/L (ref 3.5–5.1)
Potassium: 3.3 mmol/L — ABNORMAL LOW (ref 3.5–5.1)
SODIUM: 137 mmol/L (ref 135–145)
Sodium: 139 mmol/L (ref 135–145)
Sodium: 141 mmol/L (ref 135–145)

## 2016-01-02 LAB — LACTIC ACID, PLASMA: LACTIC ACID, VENOUS: 2.4 mmol/L — AB (ref 0.5–1.9)

## 2016-01-02 LAB — CBC WITH DIFFERENTIAL/PLATELET
BASOS ABS: 0 10*3/uL (ref 0–0.1)
BASOS PCT: 0 %
EOS PCT: 2 %
Eosinophils Absolute: 0.3 10*3/uL (ref 0–0.7)
HCT: 49.6 % (ref 40.0–52.0)
Hemoglobin: 16.2 g/dL (ref 13.0–18.0)
LYMPHS ABS: 6.7 10*3/uL — AB (ref 1.0–3.6)
LYMPHS PCT: 47 %
MCH: 37.5 pg — AB (ref 26.0–34.0)
MCHC: 32.7 g/dL (ref 32.0–36.0)
MCV: 114.8 fL — AB (ref 80.0–100.0)
MONOS PCT: 8 %
Monocytes Absolute: 1.2 10*3/uL — ABNORMAL HIGH (ref 0.2–1.0)
NEUTROS ABS: 6.2 10*3/uL (ref 1.4–6.5)
Neutrophils Relative %: 43 %
PLATELETS: 71 10*3/uL — AB (ref 150–440)
RBC: 4.32 MIL/uL — ABNORMAL LOW (ref 4.40–5.90)
RDW: 14.6 % — ABNORMAL HIGH (ref 11.5–14.5)
WBC: 14.4 10*3/uL — ABNORMAL HIGH (ref 3.8–10.6)

## 2016-01-02 LAB — GLUCOSE, CAPILLARY
GLUCOSE-CAPILLARY: 109 mg/dL — AB (ref 65–99)
GLUCOSE-CAPILLARY: 109 mg/dL — AB (ref 65–99)
GLUCOSE-CAPILLARY: 172 mg/dL — AB (ref 65–99)
Glucose-Capillary: 197 mg/dL — ABNORMAL HIGH (ref 65–99)
Glucose-Capillary: 187 mg/dL — ABNORMAL HIGH (ref 65–99)
Glucose-Capillary: 146 mg/dL — ABNORMAL HIGH (ref 65–99)
Glucose-Capillary: 113 mg/dL — ABNORMAL HIGH (ref 65–99)

## 2016-01-02 LAB — URINALYSIS COMPLETE WITH MICROSCOPIC (ARMC ONLY)
BILIRUBIN URINE: NEGATIVE
Glucose, UA: 50 mg/dL — AB
KETONES UR: NEGATIVE mg/dL
Leukocytes, UA: NEGATIVE
Nitrite: NEGATIVE
PH: 6 (ref 5.0–8.0)
Protein, ur: >500 mg/dL — AB
Specific Gravity, Urine: 1.018 (ref 1.005–1.030)

## 2016-01-02 LAB — TROPONIN I
TROPONIN I: 0.28 ng/mL — AB (ref <0.03)
TROPONIN I: 0.2 ng/mL — AB (ref <0.03)
TROPONIN I: 0.15 ng/mL — AB (ref <0.03)
Troponin I: 0.27 ng/mL (ref <0.03)
Troponin I: 0.53 ng/mL (ref <0.03)

## 2016-01-02 LAB — PHOSPHORUS
PHOSPHORUS: 2.8 mg/dL (ref 2.5–4.6)
PHOSPHORUS: 2.9 mg/dL (ref 2.5–4.6)
Phosphorus: 2.5 mg/dL (ref 2.5–4.6)

## 2016-01-02 LAB — PROTIME-INR
INR: 1.55
Prothrombin Time: 18.7 seconds — ABNORMAL HIGH (ref 11.4–15.2)

## 2016-01-02 LAB — MRSA PCR SCREENING: MRSA by PCR: NEGATIVE

## 2016-01-02 LAB — APTT: APTT: 38 s — AB (ref 24–36)

## 2016-01-02 LAB — ECHOCARDIOGRAM COMPLETE
Height: 74 in
Weight: 3795.44 oz

## 2016-01-02 LAB — TRIGLYCERIDES: TRIGLYCERIDES: 158 mg/dL — AB (ref <150)

## 2016-01-02 MED ORDER — ARTIFICIAL TEARS OP OINT
1.0000 | TOPICAL_OINTMENT | Freq: Three times a day (TID) | OPHTHALMIC | Status: DC
Start: 2016-01-02 — End: 2016-01-02
  Filled 2016-01-02: qty 3.5

## 2016-01-02 MED ORDER — CISATRACURIUM BOLUS VIA INFUSION
0.0500 mg/kg | Freq: Once | INTRAVENOUS | Status: DC
  Filled 2016-01-02: qty 6

## 2016-01-02 MED ORDER — SODIUM CHLORIDE 0.9% FLUSH: 10.0000 mL | INTRAVENOUS | Status: DC | PRN

## 2016-01-02 MED ORDER — FENTANYL BOLUS VIA INFUSION
50.0000 ug | INTRAVENOUS | Status: DC | PRN
  Administered 2016-01-02: 50 ug via INTRAVENOUS
  Filled 2016-01-02: qty 50

## 2016-01-02 MED ORDER — FENTANYL 2500MCG IN NS 250ML (10MCG/ML) PREMIX INFUSION
100.0000 ug/h | INTRAVENOUS | Status: DC
  Administered 2016-01-02: 100 ug/h via INTRAVENOUS
  Filled 2016-01-02: qty 250

## 2016-01-02 MED ORDER — DEXTROSE 5 % IV SOLN
1.0000 g | INTRAVENOUS | Status: DC
  Administered 2016-01-02 – 2016-01-03 (×2): 1 g via INTRAVENOUS
  Filled 2016-01-02 (×3): qty 10

## 2016-01-02 MED ORDER — SODIUM CHLORIDE 0.9 % IV SOLN
INTRAVENOUS | Status: DC
  Administered 2016-01-02: 02:00:00 via INTRAVENOUS

## 2016-01-02 MED ORDER — CISATRACURIUM BOLUS VIA INFUSION: 0.0500 mg/kg | INTRAVENOUS | Status: DC | PRN

## 2016-01-02 MED ORDER — MAGNESIUM SULFATE 2 GM/50ML IV SOLN
2.0000 g | Freq: Once | INTRAVENOUS | Status: AC
  Administered 2016-01-02: 2 g via INTRAVENOUS
  Filled 2016-01-02: qty 50

## 2016-01-02 MED ORDER — MIDAZOLAM HCL 2 MG/2ML IJ SOLN
2.0000 mg | Freq: Once | INTRAMUSCULAR | Status: AC
  Administered 2016-01-02: 2 mg via INTRAVENOUS

## 2016-01-02 MED ORDER — POLYVINYL ALCOHOL 1.4 % OP SOLN
1.0000 [drp] | Freq: Three times a day (TID) | OPHTHALMIC | Status: DC
  Administered 2016-01-02 – 2016-01-04 (×6): 1 [drp] via OPHTHALMIC
  Filled 2016-01-02 (×2): qty 15

## 2016-01-02 MED ORDER — SODIUM CHLORIDE 0.9 % IV SOLN
2.0000 mg/h | INTRAVENOUS | Status: DC
  Filled 2016-01-02: qty 10

## 2016-01-02 MED ORDER — ORAL CARE MOUTH RINSE
15.0000 mL | Freq: Four times a day (QID) | OROMUCOSAL | Status: DC
  Administered 2016-01-02 – 2016-01-04 (×7): 15 mL via OROMUCOSAL

## 2016-01-02 MED ORDER — NOREPINEPHRINE BITARTRATE 1 MG/ML IV SOLN
0.0000 ug/min | INTRAVENOUS | Status: DC
  Filled 2016-01-02: qty 4

## 2016-01-02 MED ORDER — SODIUM CHLORIDE 0.9 % IV BOLUS (SEPSIS)
500.0000 mL | Freq: Once | INTRAVENOUS | Status: AC
  Administered 2016-01-02: 500 mL via INTRAVENOUS

## 2016-01-02 MED ORDER — FENTANYL CITRATE (PF) 100 MCG/2ML IJ SOLN
100.0000 ug | Freq: Once | INTRAMUSCULAR | Status: AC
  Administered 2016-01-02: 100 ug via INTRAVENOUS

## 2016-01-02 MED ORDER — MIDAZOLAM HCL 2 MG/2ML IJ SOLN
2.0000 mg | Freq: Once | INTRAMUSCULAR | Status: AC
  Administered 2016-01-02: 2 mg via INTRAVENOUS
  Filled 2016-01-02: qty 2

## 2016-01-02 MED ORDER — PRO-STAT SUGAR FREE PO LIQD
30.0000 mL | Freq: Two times a day (BID) | ORAL | Status: DC
  Administered 2016-01-02 – 2016-01-03 (×3): 30 mL

## 2016-01-02 MED ORDER — HYDRALAZINE HCL 20 MG/ML IJ SOLN
10.0000 mg | INTRAMUSCULAR | Status: DC | PRN
  Administered 2016-01-02: 15 mg via INTRAVENOUS
  Administered 2016-01-02 – 2016-01-04 (×3): 20 mg via INTRAVENOUS
  Filled 2016-01-02: qty 1
  Filled 2016-01-02 (×2): qty 2
  Filled 2016-01-02: qty 1

## 2016-01-02 MED ORDER — SODIUM CHLORIDE 0.9 % IV SOLN
3.0000 ug/kg/min | INTRAVENOUS | Status: DC
  Administered 2016-01-02: 3 ug/kg/min via INTRAVENOUS
  Filled 2016-01-02: qty 20

## 2016-01-02 MED ORDER — CISATRACURIUM BOLUS VIA INFUSION: 0.1000 mg/kg | Freq: Once | INTRAVENOUS | Status: DC

## 2016-01-02 MED ORDER — SODIUM CHLORIDE 0.9 % IV SOLN
INTRAVENOUS | Status: DC
  Administered 2016-01-02 – 2016-01-03 (×3): via INTRAVENOUS

## 2016-01-02 MED ORDER — FAMOTIDINE IN NACL 20-0.9 MG/50ML-% IV SOLN
20.0000 mg | Freq: Two times a day (BID) | INTRAVENOUS | Status: DC
  Administered 2016-01-02 – 2016-01-03 (×4): 20 mg via INTRAVENOUS
  Filled 2016-01-02 (×4): qty 50

## 2016-01-02 MED ORDER — LEVOTHYROXINE SODIUM 112 MCG PO TABS
112.0000 ug | ORAL_TABLET | Freq: Every day | ORAL | Status: DC
  Filled 2016-01-02: qty 1

## 2016-01-02 MED ORDER — INSULIN ASPART 100 UNIT/ML ~~LOC~~ SOLN
2.0000 [IU] | SUBCUTANEOUS | Status: DC
  Administered 2016-01-02 – 2016-01-03 (×3): 2 [IU] via SUBCUTANEOUS
  Filled 2016-01-02 (×3): qty 2

## 2016-01-02 MED ORDER — POTASSIUM CHLORIDE 10 MEQ/50ML IV SOLN
10.0000 meq | INTRAVENOUS | Status: AC
  Administered 2016-01-02 (×4): 10 meq via INTRAVENOUS
  Filled 2016-01-02 (×4): qty 50

## 2016-01-02 MED ORDER — ASPIRIN 300 MG RE SUPP: 300.0000 mg | RECTAL | Status: DC

## 2016-01-02 MED ORDER — PROPOFOL 1000 MG/100ML IV EMUL
INTRAVENOUS | Status: AC
  Filled 2016-01-02: qty 100

## 2016-01-02 MED ORDER — HEPARIN SODIUM (PORCINE) 5000 UNIT/ML IJ SOLN: 5000.0000 [IU] | Freq: Three times a day (TID) | INTRAMUSCULAR | Status: DC

## 2016-01-02 MED ORDER — CHLORHEXIDINE GLUCONATE 0.12% ORAL RINSE (MEDLINE KIT)
15.0000 mL | Freq: Two times a day (BID) | OROMUCOSAL | Status: DC
  Administered 2016-01-02 – 2016-01-04 (×4): 15 mL via OROMUCOSAL

## 2016-01-02 MED ORDER — ACETAMINOPHEN 325 MG PO TABS
650.0000 mg | ORAL_TABLET | Freq: Four times a day (QID) | ORAL | Status: DC | PRN
  Administered 2016-01-02 – 2016-01-03 (×2): 650 mg via ORAL
  Filled 2016-01-02 (×2): qty 2

## 2016-01-02 MED ORDER — FENTANYL 2500MCG IN NS 250ML (10MCG/ML) PREMIX INFUSION
100.0000 ug/h | INTRAVENOUS | Status: DC
  Administered 2016-01-02: 200 ug/h via INTRAVENOUS
  Administered 2016-01-03 (×2): 100 ug/h via INTRAVENOUS
  Filled 2016-01-02 (×3): qty 250

## 2016-01-02 MED ORDER — DEXTROSE 5 % IV SOLN
500.0000 mg | INTRAVENOUS | Status: DC
  Administered 2016-01-02 – 2016-01-03 (×2): 500 mg via INTRAVENOUS
  Filled 2016-01-02 (×3): qty 500

## 2016-01-02 MED ORDER — SODIUM CHLORIDE 0.9% FLUSH
10.0000 mL | Freq: Two times a day (BID) | INTRAVENOUS | Status: DC
  Administered 2016-01-02 – 2016-01-03 (×3): 10 mL

## 2016-01-02 MED ORDER — SODIUM CHLORIDE 0.9 % IV SOLN: 1.0000 ug/kg/min | INTRAVENOUS | Status: DC

## 2016-01-02 MED ORDER — PROPOFOL 1000 MG/100ML IV EMUL
5.0000 ug/kg/min | INTRAVENOUS | Status: DC
  Administered 2016-01-02: 20 ug/kg/min via INTRAVENOUS
  Administered 2016-01-02: 25 ug/kg/min via INTRAVENOUS
  Administered 2016-01-02 (×2): 20 ug/kg/min via INTRAVENOUS
  Administered 2016-01-03: 15 ug/kg/min via INTRAVENOUS
  Administered 2016-01-03: 30 ug/kg/min via INTRAVENOUS
  Administered 2016-01-03: 15 ug/kg/min via INTRAVENOUS
  Filled 2016-01-02 (×6): qty 100

## 2016-01-02 MED ORDER — MIDAZOLAM BOLUS VIA INFUSION
2.0000 mg | INTRAVENOUS | Status: DC | PRN
  Filled 2016-01-02: qty 2

## 2016-01-02 MED ORDER — VITAL HIGH PROTEIN PO LIQD
1000.0000 mL | ORAL | Status: DC
  Administered 2016-01-02: 20:00:00
  Administered 2016-01-02: 1000 mL
  Administered 2016-01-02 – 2016-01-03 (×6)

## 2016-01-02 MED ORDER — ARTIFICIAL TEARS OP OINT
1.0000 "application " | TOPICAL_OINTMENT | Freq: Three times a day (TID) | OPHTHALMIC | Status: DC
  Filled 2016-01-02: qty 3.5

## 2016-01-02 MED ORDER — HYDRALAZINE HCL 20 MG/ML IJ SOLN
20.0000 mg | Freq: Once | INTRAMUSCULAR | Status: AC
  Administered 2016-01-02: 20 mg via INTRAVENOUS
  Filled 2016-01-02: qty 1

## 2016-01-02 MED ORDER — ENOXAPARIN SODIUM 40 MG/0.4ML ~~LOC~~ SOLN
40.0000 mg | SUBCUTANEOUS | Status: DC
  Administered 2016-01-02 – 2016-01-03 (×2): 40 mg via SUBCUTANEOUS
  Filled 2016-01-02 (×2): qty 0.4

## 2016-01-02 NOTE — Progress Notes (Signed)
Patient remains on cooling protocol. Ice packs used and one dose of tylenol given. Maintain a rass of -4 to -5. Neuromuscular blockade has been off review MAR. AT 15:30 TOF was one. At 17:30 TOF zero. Re-checked second site and TOF zero. Orders per Dr Levada SchillingSummers to check every two hours. If patient reaches train of four with 3 to 4 twitches or starts to shiver call him. Started tube feeds, foley intact. CVP reading was 10 this afternoon. Potassium and magnesium replaced. Echo and EEG performed. Wife updated throughout the day.
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
## 2016-01-02 NOTE — Progress Notes (Signed)
Per Dr Dema SeverinMungal do not bolus patient with paralytic. Dr Dema SeverinMungal aware that we do not have BISS protocol [facility], which I verified that with charge RN Kennyth ArnoldStacy.

## 2016-01-02 NOTE — Progress Notes (Signed)
   01/02/16 0100  Clinical Encounter Type  Visited With Patient;Family;Patient and family together;Health care provider  Visit Type Initial;Follow-up;Psychological support;Spiritual support;Social support;Critical Care;ED  Referral From Care management  Consult/Referral To Chaplain  Spiritual Encounters  Spiritual Needs Prayer;Ritual;Emotional  Stress Factors  Patient Stress Factors None identified  Family Stress Factors Exhausted;Family relationships;Lack of knowledge;Loss of control   Chaplain responded to a post-CPR page @11 :30pm. Chaplain 1st checked in with the nurse and then went to the lobby to find family. Chaplain found Pt's wife and sister in laws in the parking lot "having a smoke". Chaplain introduced herself and offered words of comfort. Once family was ready chaplain provided emotional and spiritual care via prayer. Wife reported that she hadn't eaten "all day". Chaplain walked family to the lower food court and provided ministry of presence and hospitality by extending coffee and water. Chaplain also made sure Pt's belonging were placed in wife's hands (a pair of blue pants and wallet)   A source of hope/strength/ comfort is her faith in God. At this time wife is in search for the sacred in her husband's situation. My assessment is that there might be some guilt (on the wife part) due to an argument between Pt and wife; which is probably adding to her difficulty of coping with the Pt's status.   Chaplain continues to provide care for this family as they move to ICU.

## 2016-01-02 NOTE — Progress Notes (Signed)
Initial Nutrition Assessment  DOCUMENTATION CODES:   Obesity unspecified  INTERVENTION:  -Begin pt on trickle feeds, Vital High Protein @ 2220mL/hr via OGT Goal: Vital High Protein @ 1140mL/hr, Pro-Stat 60mL TID Provides 1560 calories, 174gm protein, and 803cc free water  NUTRITION DIAGNOSIS:   Inadequate oral intake related to inability to eat as evidenced by NPO status.  GOAL:   Provide needs based on ASPEN/SCCM guidelines  MONITOR:   PO intake, I & O's, Labs, Vent status, Weight trends, TF tolerance  REASON FOR ASSESSMENT:   Ventilator, Consult Enteral/tube feeding initiation and management  ASSESSMENT:   Kyle Gomez is a 55 yo  Male with past medical history significant for alcoholism, anxiety, bipolar disorder, chronic pain, depression, liver cirrhosis, GERD. Apparently patient had an argument with his wife   Patient is currently intubated on ventilator support MV: 8.2 L/min Temp (24hrs), Avg:97.5 F (36.4 C), Min:95.5 F (35.3 C), Max:101.1 F (38.4 C) Discussed in Rounds Propofol: 13.1 ml/hr --> 346 calories Admitted for cardiac arrest, on [place] protocol, but temp has fallen a little since pt reached 98.6 @ 0600 Labs and Medications reviewed: CBGs 146-182, K 3.3, Ca 7.2, Mg 1.6, Tot Bili 1.4 KCL 10mEq, NS @ 3375mL/hr, fentanyl drip Nimbex held at this time  Diet Order:  Diet NPO time specified  Skin:  Reviewed, no issues  Last BM:  PTA  Height:   Ht Readings from Last 1 Encounters:  01/02/16 6\' 2"  (1.88 m)    Weight:   Wt Readings from Last 1 Encounters:  01/02/16 237 lb 3.4 oz (107.6 kg)    Ideal Body Weight:  86.36 kg  BMI:  Body mass index is 30.46 kg/m.  Estimated Nutritional Needs:   Kcal:  [id number] calories  Protein:  173 gm  Fluid:  Per MD/NP/PA  EDUCATION NEEDS:   No education needs identified at this time  Dionne AnoWilliam M. Merritt Mccravy, MS, RD LDN Inpatient Clinical Dietitian Pager [phone]

## 2016-01-02 NOTE — Procedures (Signed)
Central Venous Catheter Insertion Procedure Note - Right Femoral [facility] [id number] 11/26/1960  Procedure: Insertion of Central Venous Catheter Indications: Assessment of intravascular volume, Drug and/or fluid administration and Frequent blood sampling  Procedure Details Consent: Unable to obtain consent because of emergent medical necessity. Time Out: Verified patient identification, verified procedure, site/side was marked, verified correct patient position, special equipment/implants available, medications/allergies/relevent history reviewed, required imaging and test results available.  Performed  Maximum sterile technique was used including antiseptics, cap, gloves, gown, hand hygiene, mask and sheet. Skin prep: Chlorhexidine; local anesthetic administered A antimicrobial bonded/coated triple lumen catheter was placed in the right femoral vein due to emergent situation using the Seldinger technique.  Evaluation Blood flow good Complications: No apparent complications Patient did tolerate procedure well. Procedure performed under direct ultrasound guidance for real time vessel cannulation.       Bincy Varughese,AG-ACNP Pulmonary & Critical Care

## 2016-01-02 NOTE — Progress Notes (Signed)
Spoke with Dr Levada SchillingSummers. Neuromuscular blockade remains off. Performed TOF and 1 twitch noted. At this point orders to perform q2 TOF. If patient is noted to start shivering or TOF is 3 or 4 order to call MD back.

## 2016-01-02 NOTE — Progress Notes (Signed)
Spoke with Dr Dema SeverinMungal regarding no twitches from TOF after initial start and reassessment- Verified with second placement with charge nurse and no response noted. Please review flow sheet. Titrated medication down and at this point no twitches noted please review mar and flowsheet. Per Dr Dema SeverinMungal turned off paralytic. Continue to assess.
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
## 2016-01-02 NOTE — ED Notes (Signed)
Ice packs placed around pt's neck, under bilateral armpits, and groin area.

## 2016-01-02 NOTE — Progress Notes (Signed)
MEDICATION RELATED CONSULT NOTE   55 yo male with extensive past medical history admitted to ICU code ICE. Patient currently receiving fentanyl and propofol for sedation and has required Nimbex drip during shift.   Plan:   1. Electrolytes: Replaced magnesium 2g IV x 1 and potassium 10mEq q1hr x 4 doses. Will monitor labs with scheduled checks at 0500 & 1700.   3. Leukocytosis/Fever: Will order ceftriaxone 1g IV Q24hr x 7 doses and azithromycin 500mg  IV Q24hr x 5 doses.   3. Acetaminophen: Patient had listed allergy to acetaminophen due to cirrhosis. Per rounds ordered acetaminophen 650mg  Q6hr.   3. Levothyroxine: Will hold dose at this time.  4. Constipation: Patient started on trophic feeds. Will consider starting senna/docusate in am.    Allergies  Allergen Reactions  . Trazodone And Nefazodone Other (See Comments)    Patient reports having nightmares while taking this medication  . Aspirin   . Mango Flavor Hives  . Tylenol [Acetaminophen] Other (See Comments)    No Tylenol d/t cirrhosis    Patient Measurements: Height: 6\' 2"  (188 cm) Weight: 237 lb 3.4 oz (107.6 kg) IBW/kg (Calculated) : 82.2  Vital Signs: Temp: 97.2 F (36.2 C) (09/21 1500) Temp Source: Rectal (09/21 0900) BP: 117/71 (09/21 1500) Pulse Rate: 62 (09/21 1500) Intake/Output from previous day: 09/20 0701 - 09/21 0700 In: -  Out: 850 [Urine:850] Intake/Output from this shift: Total I/O In: -  Out: 350 [Urine:350]  Labs:  Recent Labs  12/24/2015 2236 01/02/16 0132 01/02/16 0514 01/02/16 0650  WBC 14.4* 14.7*  --  15.4*  HGB 16.2 16.2  --  15.3  HCT 49.6 48.0  --  45.7  PLT 71* 75*  --  62*  APTT  --  38*  --   --   CREATININE 2.01* 1.95* 1.34*  --   MG  --   --  1.6*  --   PHOS  --   --  2.5  --   ALBUMIN 3.6  --   --   --   PROT 7.7  --   --   --   AST 117*  --   --   --   ALT 61  --   --   --   ALKPHOS 109  --   --   --   BILITOT 1.4*  --   --   --    Estimated Creatinine  Clearance: 82.4 mL/min (by C-G formula based on SCr of 1.34 mg/dL (H)).   Microbiology: Recent Results (from the past 720 hour(s))  MRSA PCR Screening     Status: None   Collection Time: 01/02/16  3:19 AM  Result Value Ref Range Status   MRSA by PCR NEGATIVE NEGATIVE Final    Comment:        The GeneXpert MRSA Assay (FDA approved for NASAL specimens only), is one component of a comprehensive MRSA colonization surveillance program. It is not intended to diagnose MRSA infection nor to guide or monitor treatment for MRSA infections.      Pharmacy will continue to monitor and adjust.    Marquett Bertoli L 01/02/2016,4:19 PM

## 2016-01-02 NOTE — Progress Notes (Signed)
Spoke with Dr Darnelle Catalanama regarding patients TOF. I am not getting twiches when TOF started. Attempted alternate site and unable to get response. Orders to assess every 30 minutes until patient responds. Drip has been off [facility].

## 2016-01-02 NOTE — Progress Notes (Signed)
Dr. Sommers iDellie Catholicn [place] made aware that patient is 4 out of 4 twitches with train of four. Patient shows no signs of shivering at this time. Orders to discontinue train of four monitoring unless Nimbex restarted.

## 2016-01-02 NOTE — Progress Notes (Signed)
eLink Physician-Brief Progress Note Patient Name: Freddy JakschFrank P Kington DOB: 05/14/1960 MRN: [id number]   Date of Service  01/02/2016  HPI/Events of Note  Unwitnessed cardiac arrest, possible DRUG OD, gcs<8, unknown down time, probable unfavorable outcome  eICU Interventions  Admit for hypothermia protocol     Intervention Category Evaluation Type: New Patient Evaluation  Markela Wee 01/02/2016, 12:26 AM

## 2016-01-02 NOTE — Progress Notes (Signed)
*  PRELIMINARY RESULTS* Echocardiogram 2D Echocardiogram has been performed.  Cristela BlueHege, Mervil Wacker 01/02/2016, 2:42 PM

## 2016-01-02 NOTE — Progress Notes (Signed)
Pt is on the 36 degree celsius hypothermia protocol.  Pt will open eyes spontaneously at times, will not follow any commands, has no purposeful movement.  [employer] was called and at this time will not send any representative.  Spoke with Wilfred LacyPaige Crater who gave a referral number of [id number].  Coordinator Office managerMegan Sopher called and said that if pt. has care withdrawn or an EEG is done, to please call [employer] back.  Wife visited pt. Upon arrival to unit and went home to rest.

## 2016-01-02 NOTE — Progress Notes (Signed)
Spoke with Dr Dema SeverinMungal restarted sedation. No wake up assessment per protocol. Added mag and phos to morn labs. Notified that potassium was 3.3 this am. ABG obtained but has not resulted in the system.

## 2016-01-02 NOTE — Progress Notes (Signed)
eLink Physician-Brief Progress Note Patient Name: Kyle JakschFrank P Gomez DOB: 12/16/1960 MRN: [id number]   Date of Service  01/02/2016  HPI/Events of Note  Oliguria.  CVP 9  eICU Interventions  500 cc NS bolus times one     Intervention Category Intermediate Interventions: Oliguria - evaluation and management  DETERDING,ELIZABETH 01/02/2016, 11:33 PM

## 2016-01-03 ENCOUNTER — Inpatient Hospital Stay: Payer: Medicaid Other

## 2016-01-03 DIAGNOSIS — G931 Anoxic brain damage, not elsewhere classified: Secondary | ICD-10-CM

## 2016-01-03 LAB — BLOOD GAS, ARTERIAL
ACID-BASE DEFICIT: 3.8 mmol/L — AB (ref 0.0–2.0)
Acid-base deficit: 4.8 mmol/L — ABNORMAL HIGH (ref 0.0–2.0)
Bicarbonate: 20.5 mmol/L (ref 20.0–28.0)
Bicarbonate: 21.5 mmol/L (ref 20.0–28.0)
FIO2: 0.35
FIO2: 0.35
MECHVT: 500 mL
Mechanical Rate: 14
O2 SAT: 97.8 %
O2 Saturation: 99 %
PATIENT TEMPERATURE: 36
PCO2 ART: 37 mmHg (ref 32.0–48.0)
PEEP/CPAP: 5 cmH2O
PEEP: 5 cmH2O
PH ART: 7.34 — AB (ref 7.350–7.450)
PH ART: 7.36 (ref 7.350–7.450)
PO2 ART: 100 mmHg (ref 83.0–108.0)
Patient temperature: 37
VT: 500 mL
pCO2 arterial: 38 mmHg (ref 32.0–48.0)
pO2, Arterial: 138 mmHg — ABNORMAL HIGH (ref 83.0–108.0)

## 2016-01-03 LAB — MAGNESIUM
Magnesium: 2.1 mg/dL (ref 1.7–2.4)
Magnesium: 2.1 mg/dL (ref 1.7–2.4)

## 2016-01-03 LAB — PHOSPHORUS
Phosphorus: 3.1 mg/dL (ref 2.5–4.6)
Phosphorus: 3.2 mg/dL (ref 2.5–4.6)

## 2016-01-03 LAB — BASIC METABOLIC PANEL
Anion gap: 8 (ref 5–15)
BUN: 20 mg/dL (ref 6–20)
CALCIUM: 7.7 mg/dL — AB (ref 8.9–10.3)
CHLORIDE: 110 mmol/L (ref 101–111)
CO2: 21 mmol/L — ABNORMAL LOW (ref 22–32)
CREATININE: 1 mg/dL (ref 0.61–1.24)
Glucose, Bld: 114 mg/dL — ABNORMAL HIGH (ref 65–99)
Potassium: 3.6 mmol/L (ref 3.5–5.1)
SODIUM: 139 mmol/L (ref 135–145)

## 2016-01-03 LAB — CBC
HCT: 43.6 % (ref 40.0–52.0)
Hemoglobin: 14.8 g/dL (ref 13.0–18.0)
MCH: 37 pg — ABNORMAL HIGH (ref 26.0–34.0)
MCHC: 34 g/dL (ref 32.0–36.0)
MCV: 108.8 fL — AB (ref 80.0–100.0)
PLATELETS: 46 10*3/uL — AB (ref 150–440)
RBC: 4.01 MIL/uL — AB (ref 4.40–5.90)
RDW: 14.5 % (ref 11.5–14.5)
WBC: 14.6 10*3/uL — AB (ref 3.8–10.6)

## 2016-01-03 LAB — ALBUMIN: Albumin: 2.9 g/dL — ABNORMAL LOW (ref 3.5–5.0)

## 2016-01-03 LAB — GLUCOSE, CAPILLARY
GLUCOSE-CAPILLARY: 96 mg/dL (ref 65–99)
GLUCOSE-CAPILLARY: 109 mg/dL — AB (ref 65–99)
GLUCOSE-CAPILLARY: 130 mg/dL — AB (ref 65–99)
Glucose-Capillary: 103 mg/dL — ABNORMAL HIGH (ref 65–99)
Glucose-Capillary: 125 mg/dL — ABNORMAL HIGH (ref 65–99)
Glucose-Capillary: 91 mg/dL (ref 65–99)

## 2016-01-03 MED ORDER — PRO-STAT SUGAR FREE PO LIQD: 30.0000 mL | ORAL | Status: DC

## 2016-01-03 MED ORDER — PRO-STAT SUGAR FREE PO LIQD
60.0000 mL | Freq: Three times a day (TID) | ORAL | Status: DC
  Administered 2016-01-03 (×2): 60 mL

## 2016-01-03 MED ORDER — VITAL HIGH PROTEIN PO LIQD
1000.0000 mL | ORAL | Status: DC
  Administered 2016-01-03: 1000 mL
  Administered 2016-01-03 – 2016-01-04 (×3)

## 2016-01-03 MED ORDER — DOCUSATE SODIUM 50 MG/5ML PO LIQD
100.0000 mg | Freq: Two times a day (BID) | ORAL | Status: DC
  Administered 2016-01-03 (×2): 100 mg via ORAL
  Filled 2016-01-03 (×2): qty 10

## 2016-01-03 MED ORDER — IOPAMIDOL (ISOVUE-370) INJECTION 76%
100.0000 mL | Freq: Once | INTRAVENOUS | Status: AC | PRN
  Administered 2016-01-03: 100 mL via INTRAVENOUS

## 2016-01-03 MED ORDER — SENNOSIDES 8.8 MG/5ML PO SYRP
5.0000 mL | ORAL_SOLUTION | Freq: Two times a day (BID) | ORAL | Status: DC
  Administered 2016-01-03 (×2): 5 mL via ORAL
  Filled 2016-01-03 (×2): qty 5

## 2016-01-03 NOTE — Progress Notes (Signed)
SUBJECTIVE:  The patient remains intubated. He is currently being rewarmed. No cardiac arrhythmia overnight.   Vitals:   01/03/16 0530 01/03/16 0600 01/03/16 0700 01/03/16 0800  BP: 135/84 135/83 (!) 160/107   Pulse: 63 64 70   Resp: (!) 22 (!) 21 (!) 23   Temp:  (!) 96.8 F (36 C) 97.3 F (36.3 C) 98.2 F (36.8 C)  TempSrc:  Rectal    SpO2: 100% 100% 100%   Weight:      Height:        Intake/Output Summary (Last 24 hours) at 01/03/16 0827 Last data filed at 01/03/16 0600  Gross per 24 hour  Intake          3308.37 ml  Output              690 ml  Net          2618.37 ml    LABS: Basic Metabolic Panel:  Recent Labs  16/01/9608/21/17 2010 01/03/16 0416  NA 137 139  K 3.9 3.6  CL 109 110  CO2 23 21*  GLUCOSE 120* 114*  BUN 19 20  CREATININE 1.17 1.00  CALCIUM 7.8* 7.7*  MG 2.3 2.1  PHOS 2.9 3.1   Liver Function Tests:  Recent Labs  2015-05-03 2236 01/03/16 0416  AST 117*  --   ALT 61  --   ALKPHOS 109  --   BILITOT 1.4*  --   PROT 7.7  --   ALBUMIN 3.6 2.9*   No results for input(s): LIPASE, AMYLASE in the last 72 hours. CBC:  Recent Labs  2015-05-03 2236  01/02/16 0650 01/03/16 0416  WBC 14.4*  < > 15.4* 14.6*  NEUTROABS 6.2  --   --   --   HGB 16.2  < > 15.3 14.8  HCT 49.6  < > 45.7 43.6  MCV 114.8*  < > 110.1* 108.8*  PLT 71*  < > 62* 46*  < > = values in this interval not displayed. Cardiac Enzymes:  Recent Labs  01/02/16 1157 01/02/16 1814 01/02/16 2012  TROPONINI 0.28* 0.20* 0.15*   BNP: Invalid input(s): POCBNP D-Dimer: No results for input(s): DDIMER in the last 72 hours. Hemoglobin A1C: No results for input(s): HGBA1C in the last 72 hours. Fasting Lipid Panel:  Recent Labs  01/02/16 0514  TRIG 158*   Thyroid Function Tests: No results for input(s): TSH, T4TOTAL, T3FREE, THYROIDAB in the last 72 hours.  Invalid input(s): FREET3 Anemia Panel: No results for input(s): VITAMINB12, FOLATE, FERRITIN, TIBC, IRON, RETICCTPCT in  the last 72 hours.   PHYSICAL EXAM General: Well developed, well nourished, in no acute distress HEENT:  Normocephalic and atramatic Neck:  No JVD.  Lungs: Clear bilaterally to auscultation and percussion. Heart: HRRR . Normal S1 and S2 without gallops or murmurs.  Abdomen: Bowel sounds are positive, abdomen soft and non-tender  Msk:  Back normal, normal gait. Normal strength and tone for age. Extremities: No clubbing, cyanosis or edema.   Neuro:  Intubated and sedated. Psych:   Not able to evaluate  TELEMETRY: Reviewed telemetry pt in Normal sinus rhythm  ASSESSMENT AND PLAN:  1. Out of hospital cardiac arrest exact etiology still not entirely clear. He does not seem to be a primary cardiac event as the troponin remained only mildly elevated. Echocardiogram showed normal LV systolic function and wall motion. I do think we have to exclude pulmonary embolism given the moderate pulmonary hypertension on echocardiogram. This was discussed with Dr.  Mungal who is planning to proceed with CTA of the chest. If this comes back negative, the patient might require further ischemic cardiac evaluation.  2.essential hypertension:continue to monitor for now as his blood pressure is fluctuating.  3. History of alcohol and drug use.  Lorine Bears, MD, [facility] 01/03/2016 8:27 AM

## 2016-01-03 NOTE — Progress Notes (Signed)
Patient with diminished urine output. 15-9720ml/hr for last three hours. Spoke with Elink RN, awaiting further orders at this time.

## 2016-01-03 NOTE — Progress Notes (Signed)
Orders received for 500 NS bolus per Dr. Darrick Pennaeterding. Will implement and continue to monitor.

## 2016-01-03 NOTE — Progress Notes (Signed)
Subjective: Patient remains unresponsive despite decreases in Fentanyl and Diprovan  Objective: Current vital signs: BP 135/78   Pulse 81   Temp 98.4 F (36.9 C)   Resp (!) 23   Ht _0  (1.88 m)   Wt 108.9 kg (240 lb 1.3 oz)   SpO2 98%   BMI 30.82 kg/m  Vital signs in last 24 hours: Temp:  [96.3 F (35.7 C)-99 F (37.2 C)] 98.4 F (36.9 C) (09/22 1400) Pulse Rate:  [56-94] 81 (09/22 1500) Resp:  [17-33] 23 (09/22 1500) BP: (109-162)/(63-107) 135/78 (09/22 1500) SpO2:  [93 %-100 %] 98 % (09/22 1500) FiO2 (%):  [35 %] 35 % (09/22 1200) Weight:  [108.9 kg (240 lb 1.3 oz)] 108.9 kg (240 lb 1.3 oz) (09/22 0426)  Intake/Output from previous day: 09/21 0701 - 09/22 0700 In: 3308.4 [I.V.:2489.7; NG/GT:268.7; IV Piggyback:550] Out: 690 [Urine:690] Intake/Output this shift: Total I/O In: 592.6 [I.V.:287.6; NG/GT:5; IV Piggyback:300] Out: 225 [Urine:225] Nutritional status: Diet NPO time specified  Neurologic Exam: Mental Status: Patient does not respond to verbal stimuli.  Does not respond to deep sternal rub.  Does not follow commands.  No verbalizations noted.  Cranial Nerves: II: patient does not respond confrontation bilaterally, pupils right 3 mm, left 3 mm,and unreactive bilaterally III,IV,VI: doll's response absent bilaterally.  V,VII: corneal reflex absent bilaterally  VIII: patient does not respond to verbal stimuli IX,X: gag reflex reduced, XI: trapezius strength unable to test bilaterally XII: tongue strength unable to test Motor: Extremities flaccid throughout.  No spontaneous movement noted.  No purposeful movements noted. Sensory: Does not respond to noxious stimuli in any extremity. Deep Tendon Reflexes:  1+ in the upper extremities and absent in the lower extremities Plantars: mute bilaterally Cerebellar: Unable to perform  Lab Results: Basic Metabolic Panel:  Recent Labs Lab 12/23/2015 2236 01/02/16 0132 01/02/16 0514 01/02/16 1814  01/02/16 2010 01/03/16 0416  NA 138 139 141  --  137 139  K 5.5* 6.5* 3.3*  --  3.9 3.6  CL 103 107 112*  --  109 110  CO2 _1 --  23 21*  GLUCOSE 285* 190* 190*  --  120* 114*  BUN _2 --  19 20  CREATININE 2.01* 1.95* 1.34*  --  1.17 1.00  CALCIUM 8.5* 7.8* 7.2*  --  7.8* 7.7*  MG  --   --  1.6* 2.4 2.3 2.1  PHOS  --   --  2.5 2.8 2.9 3.1    Liver Function Tests:  Recent Labs Lab 01/10/2016 2236 01/03/16 0416  AST 117*  --   ALT 61  --   ALKPHOS 109  --   BILITOT 1.4*  --   PROT 7.7  --   ALBUMIN 3.6 2.9*   No results for input(s): LIPASE, AMYLASE in the last 168 hours. No results for input(s): AMMONIA in the last 168 hours.  CBC:  Recent Labs Lab 12/25/2015 2236 01/02/16 0132 01/02/16 0650 01/03/16 0416  WBC 14.4* 14.7* 15.4* 14.6*  NEUTROABS 6.2  --   --   --   HGB 16.2 16.2 15.3 14.8  HCT 49.6 48.0 45.7 43.6  MCV 114.8* 111.0* 110.1* 108.8*  PLT 71* 75* 62* 46*    Cardiac Enzymes:  Recent Labs Lab 01/02/16 0132 01/02/16 0514 01/02/16 1157 01/02/16 1814 01/02/16 2012  TROPONINI 0.27* 0.53* 0.28* 0.20* 0.15*    Lipid Panel:  Recent Labs Lab 01/02/16 0514  TRIG 158*    CBG:  Recent Labs Lab 01/02/16 1955 01/02/16 2336 01/03/16 0355 01/03/16 0734 01/03/16 1115  GLUCAP 109* 109* 96 109* 130*    Microbiology: Results for orders placed or performed during the hospital encounter of 12/25/2015  MRSA PCR Screening     Status: None   Collection Time: 01/02/16  3:19 AM  Result Value Ref Range Status   MRSA by PCR NEGATIVE NEGATIVE Final    Comment:        The GeneXpert MRSA Assay (FDA approved for NASAL specimens only), is one component of a comprehensive MRSA colonization surveillance program. It is not intended to diagnose MRSA infection nor to guide or monitor treatment for MRSA infections.     Coagulation Studies:  Recent Labs  01/02/16 0132  LABPROT 18.7*  INR 1.55    Imaging: Dg Abd 1 View  Result Date:  01/02/2016 CLINICAL DATA:  OG tube placement. EXAM: ABDOMEN - 1 VIEW COMPARISON:  12/29/2015 FINDINGS: Orogastric tube has been placed, tip overlying the level of the stomach. There has been mild decompression of the stomach. Gaseous distension of numerous bowel loops again noted. IMPRESSION: Interval placement of orogastric tube to level of the stomach. Electronically Signed   By: Nolon Nations M.D.   On: 01/02/2016 09:41   Dg Abdomen 1 View  Result Date: 12/27/2015 CLINICAL DATA:  NG tube placement. EXAM: ABDOMEN - 1 VIEW COMPARISON:  None. FINDINGS: Marked gaseous distention of the stomach and bowel could suggest a diffuse ileus. External pacer paddles are noted over the lower chest. No free air under the hemidiaphragms. The lungs are grossly clear. I do not see any NG to coursing down the esophagus and into the stomach. IMPRESSION: An NG tube is not identified in the lower chest or abdomen. Gaseous distention of the stomach and bowel. Electronically Signed   By: Marijo Sanes M.D.   On: 12/23/2015 23:29   Dg Abdomen 1 View  Result Date: 12/13/2015 CLINICAL DATA:  NG tube placement. EXAM: ABDOMEN - 1 VIEW COMPARISON:  None. FINDINGS: Marked gaseous distention of the stomach and bowel could suggest a diffuse ileus. External pacer paddles are noted over the lower chest. No free air under the hemidiaphragms. The lungs are grossly clear. I do not see any NG to coursing down the esophagus and into the stomach. IMPRESSION: An NG tube is not identified in the lower chest or abdomen. Gaseous distention of the stomach and bowel. Electronically Signed   By: Marijo Sanes M.D.   On: 01/11/2016 23:29   Ct Head Wo Contrast  Result Date: 01/03/2016 CLINICAL DATA:  55 year old male with a history of cardiac arrest with CPR. Found unresponsive. Now intubated. EXAM: CT HEAD WITHOUT CONTRAST TECHNIQUE: Contiguous axial images were obtained from the base of the skull through the vertex without intravenous contrast.  COMPARISON:  12/21/2015 FINDINGS: Brain: Since the prior CT, there has been loss of gray-white differentiation and effacement of the sulci of the bilateral hemispheres, most pronounced at the posterior aspects involving temporal, posterior frontal and parietal region. There has been development of focal hypodensity in the globus pallidus of the bilateral basal ganglia. Differential attenuation of the cerebral hemispheres compared to the cerebellum ("white cerebellum sign"). No acute intracranial hemorrhage. No midline shift. No herniation identified this time, with basal cisterns unchanged from the prior. Vascular: No significant intracranial calcifications. Skull: No acute fracture or aggressive lesion. Sinuses/Orbits: Near complete opacification of ethmoid air cells. Layered fluid within the sphenoid sinuses. The visualized nasopharynx is opacified. Other: None IMPRESSION:  Compared to the prior CT, there are now changes of developing diffuse cerebral edema, most pronounced posteriorly, compatible with anoxic brain injury. No herniation at this time, and no hemorrhage. Focal hypodensity in the bilateral basal ganglia, can be seen either with anoxic injury or toxic exposure. MRI would be indicated for further characterization of the abnormalities. These results were called by telephone at the time of interpretation on 01/03/2016 at 2:42 pm to Dr. Vilinda Boehringer , who verbally acknowledged these results. Signed, Dulcy Fanny. Earleen Newport, DO [facility] Electronically Signed   By: Corrie Mckusick D.O.   On: 01/03/2016 14:55   Ct Head Wo Contrast  Result Date: 01/02/2016 CLINICAL DATA:  Status post cardiac arrest. Found unresponsive, status post CPR. Concern for head or cervical spine injury. Initial encounter. EXAM: CT HEAD WITHOUT CONTRAST CT CERVICAL SPINE WITHOUT CONTRAST TECHNIQUE: Multidetector CT imaging of the head and cervical spine was performed following the  standard protocol without intravenous contrast. Multiplanar CT image reconstructions of the cervical spine were also generated. COMPARISON:  MRI and CT of the head performed 07/12/2015, and CT of the cervical spine performed 01/25/2012 FINDINGS: CT HEAD FINDINGS Brain: No evidence of acute infarction, hemorrhage, hydrocephalus, extra-axial collection or mass lesion/mass effect. The posterior fossa, including the cerebellum, brainstem and fourth ventricle, is within normal limits. The third and lateral ventricles, and basal ganglia are unremarkable in appearance. The cerebral hemispheres are symmetric in appearance, with normal gray-white differentiation. No mass effect or midline shift is seen. Vascular: No hyperdense vessel or unexpected calcification. Skull: There is no evidence of fracture; visualized osseous structures are unremarkable in appearance. Sinuses/Orbits: The orbits are within normal limits. The paranasal sinuses and mastoid air cells are well-aerated. Other: No significant soft tissue abnormalities are seen. CT CERVICAL SPINE FINDINGS Alignment: Normal. Skull base and vertebrae: No acute fracture. No primary bone lesion or focal pathologic process. Soft tissues and spinal canal: No prevertebral fluid or swelling. No visible canal hematoma. Disc levels: Mild multilevel disc space narrowing is noted along the lower cervical spine, with scattered small anterior and posterior disc osteophyte complexes. Upper chest: The thyroid gland is difficult to fully assess due to motion artifact, but appears grossly unremarkable. The visualized lung apices are grossly clear. Other: Note that the patient's enteric tube is partially coiled within his hypopharynx. IMPRESSION: 1. No definite evidence for anoxic brain injury at this time. No evidence of traumatic intracranial injury or fracture. 2. No evidence of fracture or subluxation along the cervical spine. 3. Mild degenerative change along the lower cervical spine.  4. Enteric tube seen partially coiled within the hypopharynx. Would retract slightly. Electronically Signed   By: Garald Balding M.D.   On: 01/02/2016 00:03   Ct Angio Chest Pe W Or Wo Contrast  Result Date: 01/03/2016 CLINICAL DATA:  Recent cardiac arrest following apparent overdose EXAM: CT ANGIOGRAPHY CHEST WITH CONTRAST TECHNIQUE: Multidetector CT imaging of the chest was performed using the standard protocol during bolus administration of intravenous contrast. Multiplanar CT image reconstructions and MIPs were obtained to evaluate the vascular anatomy. CONTRAST:  100 mL Isovue 370. COMPARISON:  None FINDINGS: Cardiovascular: The thoracic aorta shows no significant calcifications. No aneurysmal dilatation or dissection is noted. The pulmonary artery is well visualized and demonstrates a normal branching pattern. No definitive filling defects are noted to suggest pulmonary embolism. Mediastinum/Nodes: Endotracheal tube and nasogastric catheter are noted in satisfactory position. The esophagus as visualize is within normal limits. No significant hilar  or mediastinal adenopathy is noted. The thoracic inlet is unremarkable. Lungs/Pleura: Lungs are well aerated bilaterally although left lower lobe and right lower lobe consolidation is noted. Changes are somewhat more marked on the left than the right. No significant effusion is seen. Upper Abdomen: Within normal limits. Musculoskeletal: No chest wall abnormality. No acute or significant osseous findings. Review of the MIP images confirms the above findings. IMPRESSION: Bilateral lower lobe consolidation left greater than right. No evidence of aortic or pulmonary arterial abnormality. Electronically Signed   By: Inez Catalina M.D.   On: 01/03/2016 14:36   Ct Cervical Spine Wo Contrast  Result Date: 01/02/2016 CLINICAL DATA:  Status post cardiac arrest. Found unresponsive, status post CPR. Concern for head or cervical spine injury. Initial encounter. EXAM: CT  HEAD WITHOUT CONTRAST CT CERVICAL SPINE WITHOUT CONTRAST TECHNIQUE: Multidetector CT imaging of the head and cervical spine was performed following the standard protocol without intravenous contrast. Multiplanar CT image reconstructions of the cervical spine were also generated. COMPARISON:  MRI and CT of the head performed 07/12/2015, and CT of the cervical spine performed 01/25/2012 FINDINGS: CT HEAD FINDINGS Brain: No evidence of acute infarction, hemorrhage, hydrocephalus, extra-axial collection or mass lesion/mass effect. The posterior fossa, including the cerebellum, brainstem and fourth ventricle, is within normal limits. The third and lateral ventricles, and basal ganglia are unremarkable in appearance. The cerebral hemispheres are symmetric in appearance, with normal gray-white differentiation. No mass effect or midline shift is seen. Vascular: No hyperdense vessel or unexpected calcification. Skull: There is no evidence of fracture; visualized osseous structures are unremarkable in appearance. Sinuses/Orbits: The orbits are within normal limits. The paranasal sinuses and mastoid air cells are well-aerated. Other: No significant soft tissue abnormalities are seen. CT CERVICAL SPINE FINDINGS Alignment: Normal. Skull base and vertebrae: No acute fracture. No primary bone lesion or focal pathologic process. Soft tissues and spinal canal: No prevertebral fluid or swelling. No visible canal hematoma. Disc levels: Mild multilevel disc space narrowing is noted along the lower cervical spine, with scattered small anterior and posterior disc osteophyte complexes. Upper chest: The thyroid gland is difficult to fully assess due to motion artifact, but appears grossly unremarkable. The visualized lung apices are grossly clear. Other: Note that the patient's enteric tube is partially coiled within his hypopharynx. IMPRESSION: 1. No definite evidence for anoxic brain injury at this time. No evidence of traumatic  intracranial injury or fracture. 2. No evidence of fracture or subluxation along the cervical spine. 3. Mild degenerative change along the lower cervical spine. 4. Enteric tube seen partially coiled within the hypopharynx. Would retract slightly. Electronically Signed   By: Garald Balding M.D.   On: 01/02/2016 00:03   Dg Chest Port 1 View  Result Date: 12/24/2015 CLINICAL DATA:  Initial evaluation for endotracheal tube placement. EXAM: PORTABLE CHEST 1 VIEW COMPARISON:  Prior radiograph from 10/21/2015. FINDINGS: Endotracheal tube in place with tip located approximately 4.2 cm above the carina. Cardiomegaly stable. Mediastinal silhouette within normal limits. Defibrillator pad overlies the chest. Lungs normally inflated. Mild perihilar vascular congestion without overt pulmonary edema. No pleural effusion. No focal infiltrates. No pneumothorax. Osseous structures within normal limits. IMPRESSION: 1. Tip of the endotracheal tube 4.2 cm above the carina. 2. Stable mild cardiomegaly with mild vascular congestion without overt pulmonary edema. Electronically Signed   By: Jeannine Boga M.D.   On: 01/02/2016 23:26    Medications:  I have reviewed the patient's current medications. Scheduled: . azithromycin  500 mg Intravenous Q24H  .  cefTRIAXone (ROCEPHIN)  IV  1 g Intravenous Q24H  . chlorhexidine gluconate (MEDLINE KIT)  15 mL Mouth Rinse BID  . cisatracurium  0.05 mg/kg Intravenous Once  . sennosides  5 mL Oral BID   And  . docusate  100 mg Oral BID  . famotidine (PEPCID) IV  20 mg Intravenous Q12H  . feeding supplement (PRO-STAT SUGAR FREE 64)  60 mL Per Tube TID  . feeding supplement (VITAL HIGH PROTEIN)  1,000 mL Per Tube Q24H  . insulin aspart  2-6 Units Subcutaneous Q4H  . mouth rinse  15 mL Mouth Rinse QID  . polyvinyl alcohol  1 drop Both Eyes Q8H  . sodium chloride flush  10-40 mL Intracatheter Q12H    Assessment/Plan: Patient remains unresponsive.  Repeat head CT personally  reviewed and shows some early evidence of anoxic brain injury.  As of today patient with some respiratory drive therefore not considered brain dead.  With review of CT though prognosis is quite poor for functional recovery.  Family not present for conversation at this time.  Recommendations: 1.  Agree with continued weaning of sedation   LOS: 1 day   Alexis Goodell, MD Neurology [phone] 01/03/2016  3:15 PM

## 2016-01-03 NOTE — Progress Notes (Signed)
Pt. Was transported to CT and back to CCU on the transport vent.

## 2016-01-03 NOTE — Progress Notes (Signed)
Patient's propofol and fentanyl increased due to patients RR 40-50's. Currently patients RR low to mid 20's and resting comfortably.

## 2016-01-03 NOTE — Progress Notes (Signed)
MEDICATION RELATED CONSULT NOTE   55 yo male with extensive past medical history admitted to ICU code ICE. Patient currently receiving fentanyl and propofol for sedation and has required Nimbex drip during shift.   Plan:   1. Electrolytes WNL. Will f/u mag and phos this PM.   2. Leukocytosis/Fever: After discussion with Dr. Dema Severin, will continue CTX x 3 days and azithromycin x 5 days total.   3. Constipation: Will begin senna/docusate bid.    Allergies  Allergen Reactions  . Trazodone And Nefazodone Other (See Comments)    Patient reports having nightmares while taking this medication  . Aspirin   . Mango Flavor Hives  . Tylenol [Acetaminophen] Other (See Comments)    No Tylenol d/t cirrhosis    Patient Measurements: Height: 6\' 2"  (188 cm) Weight: 240 lb 1.3 oz (108.9 kg) IBW/kg (Calculated) : 82.2  Vital Signs: Temp: 98.1 F (36.7 C) (09/22 1030) Temp Source: Rectal (09/22 0800) BP: 127/70 (09/22 1000) Pulse Rate: 79 (09/22 1000) Intake/Output from previous day: 09/21 0701 - 09/22 0700 In: 3308.4 [I.V.:2489.7; NG/GT:268.7; IV Piggyback:550] Out: 690 [Urine:690] Intake/Output from this shift: Total I/O In: 203.5 [I.V.:198.5; NG/GT:5] Out: 100 [Urine:100]  Labs:  Recent Labs  01/10/2016 2236 01/02/16 0132  01/02/16 0514 01/02/16 0650 01/02/16 1814 01/02/16 2010 01/03/16 0416  WBC 14.4* 14.7*  --   --  15.4*  --   --  14.6*  HGB 16.2 16.2  --   --  15.3  --   --  14.8  HCT 49.6 48.0  --   --  45.7  --   --  43.6  PLT 71* 75*  --   --  62*  --   --  46*  APTT  --  38*  --   --   --   --   --   --   CREATININE 2.01* 1.95*  --  1.34*  --   --  1.17 1.00  MG  --   --   < > 1.6*  --  2.4 2.3 2.1  PHOS  --   --   < > 2.5  --  2.8 2.9 3.1  ALBUMIN 3.6  --   --   --   --   --   --  2.9*  PROT 7.7  --   --   --   --   --   --   --   AST 117*  --   --   --   --   --   --   --   ALT 61  --   --   --   --   --   --   --   ALKPHOS 109  --   --   --   --   --   --    --   BILITOT 1.4*  --   --   --   --   --   --   --   < > = values in this interval not displayed. Estimated Creatinine Clearance: 111 mL/min (by C-G formula based on SCr of 1 mg/dL).   Microbiology: Recent Results (from the past 720 hour(s))  MRSA PCR Screening     Status: None   Collection Time: 01/02/16  3:19 AM  Result Value Ref Range Status   MRSA by PCR NEGATIVE NEGATIVE Final    Comment:        The GeneXpert MRSA Assay (FDA approved for NASAL  specimens only), is one component of a comprehensive MRSA colonization surveillance program. It is not intended to diagnose MRSA infection nor to guide or monitor treatment for MRSA infections.      Pharmacy will continue to monitor and adjust.    Valentina GuChristy, Jamale Spangler D 01/03/2016,11:00 AM

## 2016-01-03 NOTE — Progress Notes (Signed)
PULMONARY / CRITICAL CARE MEDICINE   Name: Kyle Gomez MRN: [id number] DOB: 12/20/1960    ADMISSION DATE:  09-04-15  BRIEF HISTORY: 55 yo  Male with past medical history significant for alcoholism, anxiety, bipolar disorder, chronic pain, depression, liver cirrhosis, GERD. Apparently patient had an argument with his wife. Patient told his wife that he was going out to get some Heroin. Wife states that patient has been complaining of chest pain all week. In the meantime wife went to take a bath. When she came back she found him unresponsive and began CPR until EMS arrived. EMS found the patient in asystole. EMS received CPR. Patient was given 3 doses of epi, 1 amp of D50 and 2 mg of Narcan. EMS reports ROSC after completion of Third round of epinephrine  SUBJECTIVE:  No acute events overnight, mild low UOP, CTA chest today, rewarming today  VITAL SIGNS: Temp:  [96.1 F (35.6 C)-98.6 F (37 C)] 98.6 F (37 C) (09/22 0835) Pulse Rate:  [56-87] 87 (09/22 0800) Resp:  [14-31] 31 (09/22 0800) BP: (109-162)/(63-107) 162/96 (09/22 0800) SpO2:  [93 %-100 %] 99 % (09/22 0800) FiO2 (%):  [35 %] 35 % (09/22 0800) Weight:  [240 lb 1.3 oz (108.9 kg)] 240 lb 1.3 oz (108.9 kg) (09/22 0426) HEMODYNAMICS: CVP:  [10 mmHg-17 mmHg] 17 mmHg VENTILATOR SETTINGS: Vent Mode: PRVC FiO2 (%):  [35 %] 35 % Set Rate:  [14 bmp] 14 bmp Vt Set:  [500 mL] 500 mL PEEP:  [5 cmH20] 5 cmH20 INTAKE / OUTPUT:  Intake/Output Summary (Last 24 hours) at 01/03/16 0839 Last data filed at 01/03/16 0815  Gross per 24 hour  Intake          3496.87 ml  Output              690 ml  Net          2806.87 ml    Review of Systems  Unable to perform ROS: Critical illness    Physical Exam  Constitutional: He is well-developed, well-nourished, and in no distress.  HENT:  Head: Normocephalic and atraumatic.  Right Ear: External ear normal.  Left Ear: External ear normal.  Neck: Neck supple.  Cardiovascular: Normal rate  and regular rhythm.   Pulmonary/Chest:  On MV, mild shallow BS, but clear  Abdominal: Soft. Bowel sounds are normal.  Musculoskeletal: He exhibits no edema.  Neurological:  Cannot assess-intubated and sedated  Skin: Skin is warm and dry.  Nursing note and vitals reviewed.    LABS:  CBC  Recent Labs Lab 01/02/16 0132 01/02/16 0650 01/03/16 0416  WBC 14.7* 15.4* 14.6*  HGB 16.2 15.3 14.8  HCT 48.0 45.7 43.6  PLT 75* 62* 46*   Coag's  Recent Labs Lab 01/02/16 0132  APTT 38*  INR 1.55   BMET  Recent Labs Lab 01/02/16 0514 01/02/16 2010 01/03/16 0416  NA 141 137 139  K 3.3* 3.9 3.6  CL 112* 109 110  CO2 22 23 21*  BUN 13 19 20   CREATININE 1.34* 1.17 1.00  GLUCOSE 190* 120* 114*   Electrolytes  Recent Labs Lab 01/02/16 0514 01/02/16 1814 01/02/16 2010 01/03/16 0416  CALCIUM 7.2*  --  7.8* 7.7*  MG 1.6* 2.4 2.3 2.1  PHOS 2.5 2.8 2.9 3.1   Sepsis Markers  Recent Labs Lab Aug 03, 2015 2306 01/02/16 0140  LATICACIDVEN 5.0* 2.4*   ABG  Recent Labs Lab Aug 03, 2015 2319 01/03/16 0415  PHART 7.32* 7.36  PCO2ART 32 37  PO2ART 505* 100   Liver Enzymes  Recent Labs Lab 12/25/2015 2236 01/03/16 0416  AST 117*  --   ALT 61  --   ALKPHOS 109  --   BILITOT 1.4*  --   ALBUMIN 3.6 2.9*   Cardiac Enzymes  Recent Labs Lab 01/02/16 1157 01/02/16 1814 01/02/16 2012  TROPONINI 0.28* 0.20* 0.15*   Glucose  Recent Labs Lab 01/02/16 1113 01/02/16 1611 01/02/16 1955 01/02/16 2336 01/03/16 0355 01/03/16 0734  GLUCAP 146* 113* 109* 109* 96 109*    Imaging Dg Abd 1 View  Result Date: 01/02/2016 CLINICAL DATA:  OG tube placement. EXAM: ABDOMEN - 1 VIEW COMPARISON:  12/14/2015 FINDINGS: Orogastric tube has been placed, tip overlying the level of the stomach. There has been mild decompression of the stomach. Gaseous distension of numerous bowel loops again noted. IMPRESSION: Interval placement of orogastric tube to level of the stomach.  Electronically Signed   By: Norva Pavlov M.D.   On: 01/02/2016 09:41   STUDIES:  9/20 urine drug screen>> positive for benzos and opiates 9/21 CT Head> Neg 9/22 CTA chest> 9/21 Echo>EF 60-65%, nl LV, PASP 57, G1 diastolic dysfunction 9/22 CT Head> grey-white matter blunting, early cerebral edema posteriorly and diffusely SIGNIFICANT EVENTS: 9/20>cardiac arrest 20-25 downtime, hypothermia protocol, asystole/PEA 9/22>rewarming, CTA chest neg for PE, CT head>early anoxic brain injury  LINES: 9/21 right femoral TLC>>  CULTURES: Bld Cx 9/22>  ANTIBIOTICS: Rocephin 9/21>9/24 Azithro 9/21>9/26   DISCUSSION: 55 year old male with PEA cardiac arrest, unknown downtime, currently on hypothermia   ASSESSMENT / PLAN:  PULMONARY A Acute respiratory failure  Status post PEA arrest Bilateral Lower Lobes consolidation - CAP P Full vent support,  SBT Trials when tolerated after 24 hours Hypothermia 36-completed Fentanyl/Versed for sedation Routine ABG Unknown cause for cardiac arrest, no sig trop elevation, nl echo, complained of SOB prior to the event, will check CTA Chest for PE eval>negativewith b/l lower lobe consolidation, will treat as CAP  CARDIOVASCULAR A:  Cardiac arrest Asystole PEA arrest Troponin elevation Elevated blood pressure  P:  Prn Hydralazine Continuous telemetry keep map goals >65 9/21 Echo>EF 60-65%, nl LV, PASP 57, G1 diastolic dysfunction Trend troponin   RENAL A: Acute kidney injury possibly related to cardiac arrest Hyperkalemia Low UOP P:   Strict intake output Replace electrolytes per ICU protocol Cr improving Follow UOP  GASTROINTESTINAL A GERD History of hepatitis C Elevated liver enzymes History of liver cirrhosis Alcohol abuse P:   npo Follow liver enzymes Famotidine for GIP   HEMATOLOGIC A:   Thrombocytopenia-chronic  P:  SCDS Plts=46, stop lovenox for DVT prophylaxis Monitor Plts  INFECTIOUS A:    Leukocytosis r/t stress response P:   Monitor fever curve  ENDOCRINE A Hypothyroidism P:    continue levothyroxine  NEUROLOGICAL Acute encephalopathy status post PEA arrest Tobacco abuse/alcohol abuse Polysubstance dependence Opioid dependence Major depressive disorder  Chronic pain syndrome  P:   RASS goal -1 Neurology following EEG Pending 9/22 CT head with signs of early anoxic injury and diffuse cerebral edema  Thank you for consulting [facility], Please feel free to contacts [place] with any questions at [phone] (please enter 7-digits).  I have personally obtained a history, examined the patient, evaluated laboratory and imaging results, formulated the assessment and plan and placed orders.  The Patient requires high complexity decision making for assessment and support, frequent evaluation and titration of therapies, application of advanced monitoring technologies and extensive interpretation of multiple databases.  Critical Care Time devoted to patient care services described in this note is 45 minutes.   Overall, patient is critically ill, prognosis is guarded. Patient at high risk for cardiac arrest and death.    Stephanie Acre, MD [facility] Pager [phone] (please enter 7-digits) On Call Pager - [phone] (please enter 7-digits)  Note: This note was prepared with Dragon dictation along with smaller phrase technology. Any transcriptional errors that result from this process are unintentional.

## 2016-01-03 NOTE — Progress Notes (Signed)
PHARMACY - CRITICAL CARE PROGRESS NOTE  Pharmacy Consult for Electrolytes Indication: Magnesium , Phosphorous   Allergies  Allergen Reactions  . Trazodone And Nefazodone Other (See Comments)    Patient reports having nightmares while taking this medication  . Aspirin   . Mango Flavor Hives  . Tylenol [Acetaminophen] Other (See Comments)    No Tylenol d/t cirrhosis    Patient Measurements: Height: 6\' 2"  (188 cm) Weight: 240 lb 1.3 oz (108.9 kg) IBW/kg (Calculated) : 82.2 Adjusted Body Weight:   Vital Signs: Temp: 98.6 F (37 C) (09/22 1800) Temp Source: Rectal (09/22 0800) BP: 157/98 (09/22 1800) Pulse Rate: 81 (09/22 1800) Intake/Output from previous day: 09/21 0701 - 09/22 0700 In: 3308.4 [I.V.:2489.7; NG/GT:268.7; IV Piggyback:550] Out: 690 [Urine:690] Intake/Output from this shift: No intake/output data recorded. Vent settings for last 24 hours: Vent Mode: PRVC FiO2 (%):  [35 %] 35 % Set Rate:  [14 bmp] 14 bmp Vt Set:  [500 mL] 500 mL PEEP:  [5 cmH20] 5 cmH20  Labs:  Recent Labs  01/09/2016 2236 01/02/16 0132 01/02/16 0514 01/02/16 0650  01/02/16 2010 01/03/16 0416 01/03/16 1649  WBC 14.4* 14.7*  --  15.4*  --   --  14.6*  --   HGB 16.2 16.2  --  15.3  --   --  14.8  --   HCT 49.6 48.0  --  45.7  --   --  43.6  --   PLT 71* 75*  --  62*  --   --  46*  --   APTT  --  38*  --   --   --   --   --   --   INR  --  1.55  --   --   --   --   --   --   CREATININE 2.01* 1.95* 1.34*  --   --  1.17 1.00  --   MG  --   --  1.6*  --   < > 2.3 2.1 2.1  PHOS  --   --  2.5  --   < > 2.9 3.1 3.2  ALBUMIN 3.6  --   --   --   --   --  2.9*  --   PROT 7.7  --   --   --   --   --   --   --   AST 117*  --   --   --   --   --   --   --   ALT 61  --   --   --   --   --   --   --   ALKPHOS 109  --   --   --   --   --   --   --   BILITOT 1.4*  --   --   --   --   --   --   --   < > = values in this interval not displayed. Estimated Creatinine Clearance: 111 mL/min (by C-G  formula based on SCr of 1 mg/dL).   Recent Labs  01/03/16 0734 01/03/16 1115 01/03/16 1618  GLUCAP 109* 130* 91    Microbiology: Recent Results (from the past 720 hour(s))  MRSA PCR Screening     Status: None   Collection Time: 01/02/16  3:19 AM  Result Value Ref Range Status   MRSA by PCR NEGATIVE NEGATIVE Final    Comment:  The GeneXpert MRSA Assay (FDA approved for NASAL specimens only), is one component of a comprehensive MRSA colonization surveillance program. It is not intended to diagnose MRSA infection nor to guide or monitor treatment for MRSA infections.     Medications:  Prescriptions Prior to Admission  Medication Sig Dispense Refill Last Dose  . busPIRone (BUSPAR) 10 MG tablet Take 10 mg by mouth 3 (three) times daily.  0 10/21/2015 at Unknown time  . diazepam (VALIUM) 5 MG tablet Take 5 mg by mouth 2 (two) times daily.  0 unknown at unknown  . gabapentin (NEURONTIN) 300 MG capsule take 1 capsule by mouth three times a day  0 10/21/2015 at Unknown time  . hydrOXYzine (VISTARIL) 25 MG capsule Take 1 capsule by mouth 3 (three) times daily as needed.  0 prn at prn  . Levothyroxine Sodium (TIROSINT) 112 MCG CAPS Take 1 capsule (112 mcg total) by mouth daily before breakfast. 90 capsule 1 10/21/2015 at unknown  . meloxicam (MOBIC) 7.5 MG tablet Take 7.5 mg by mouth 2 (two) times daily.  0 unknown at unknown  . methocarbamol (ROBAXIN) 500 MG tablet Take 1 tablet by mouth 3 (three) times daily as needed.  0 prn at prn  . mometasone (ELOCON) 0.1 % ointment Apply 1 application topically daily as needed.  0 prn at prn  . nadolol (CORGARD) 40 MG tablet Take 40 mg by mouth daily.   0 unknown at unknown  . PROAIR HFA 108 (90 Base) MCG/ACT inhaler Inhale 1-2 puffs into the lungs every 4 (four) hours as needed for wheezing or shortness of breath.   0 prn at prn  . QVAR 80 MCG/ACT inhaler Inhale 1 puff into the lungs daily.   0 10/21/2015 at unknown  . ranitidine (ZANTAC)  75 MG tablet Take 75 mg by mouth 2 (two) times daily as needed.    prn at prn  . SPIRIVA HANDIHALER 18 MCG inhalation capsule Place 1 capsule into inhaler and inhale daily.  0 10/21/2015 at unknown  . tamsulosin (FLOMAX) 0.4 MG CAPS capsule Take 0.4 mg by mouth daily.   1 10/21/2015 at Unknown time    Assessment: 9/22 @ 16:49 :   Phos = 3.2                             Mag = 2.1   Goal of Therapy:  Normalization of electrolytes   Plan:  No electrolyte supplementation needed at this time.   Will recheck electrolytes on 9/23 with AM labs.   Broady Lafoy D 01/03/2016,7:18 PM

## 2016-01-03 NOTE — Progress Notes (Signed)
Paged the Neurologist, wife would like to speak with her and get test results.

## 2016-01-03 NOTE — Progress Notes (Addendum)
I visited and provided spiritual support and prayers to the family as they gathered to pay the last tribute to the patient who was about to expire. I continually offered presence and support to other family members who arrived later. Promised to follow-up the case the next morning at 9 am.

## 2016-01-03 NOTE — Progress Notes (Signed)
Update Updated family (Wife, sisters, mother) about CT head changes and anoxic brain injury with diffuse cerebral edema.  Poor prognosis, especially with brain swelling and grey-white matter effacement, no gag reflex this afternoon.  Family made patient a DNR/DNI. RN and chaplain services at the bedside.   Stephanie AcreVishal Khris Jansson, MD [facility] Pager [phone]- [phone] (please enter 7-digits) On Call Pager - [phone] (please enter 7-digits)

## 2016-01-03 NOTE — Progress Notes (Signed)
Nutrition Follow-up  DOCUMENTATION CODES:   Obesity unspecified  INTERVENTION:  -TF: recommend increasing TF to rate of 30 ml/hr with Protat 6 packets daily providing 1320 kcals,153 g protein and 605 mL of free water. Tube maintenance free water flushes only at present. Meets >80% protein needs at present. Additional kcals from diprivan infusion taken into account as well. Continue to assess and make adjustments to TF as needed  NUTRITION DIAGNOSIS:   Inadequate oral intake related to inability to eat as evidenced by NPO status.  Being addressed via TF  GOAL:   Provide needs based on ASPEN/SCCM guidelines  MONITOR:   PO intake, I & O's, Labs, Vent status, Weight trends, TF tolerance  REASON FOR ASSESSMENT:   Ventilator, Consult Enteral/tube feeding initiation and management  ASSESSMENT:   55 yo male admitted with PEA cardiac arrest with unknown down time, requiring intubation, on hypothermic protocol. Pt with hx of substance abuse  Pt remains on vent, rewarming completed at 830 am.   Tolerating trophic feedings of Vital High Protein   Diprivan: 19.4 ml/hr (512 kcals in 24 hours if no rate changes or interruptions)   Diet Order:  Diet NPO time specified  Skin:  Reviewed, no issues  Last BM:  9/20   Labs: reviewed  Meds: NS at 50 ml/hr, diprivan  Height:   Ht Readings from Last 1 Encounters:  01/02/16 6\' 2"  (1.88 m)    Weight:   Wt Readings from Last 1 Encounters:  01/03/16 240 lb 1.3 oz (108.9 kg)    Ideal Body Weight:  86.36 kg  BMI:  Body mass index is 30.82 kg/m.  Estimated Nutritional Needs:   Kcal:  [id number] calories  Protein:  173 gm  Fluid:  Per MD/NP/PA  EDUCATION NEEDS:   No education needs identified at this time  Kyle StarcherCate Brack Shaddock MS, RD, LDN [phone] Pager  [phone] Weekend/On-Call Pager

## 2016-01-03 NOTE — Progress Notes (Signed)
Hypothermia re-warming phase initiated per protocol.

## 2016-01-04 DIAGNOSIS — N179 Acute kidney failure, unspecified: Secondary | ICD-10-CM

## 2016-01-04 LAB — COMPREHENSIVE METABOLIC PANEL
ALBUMIN: 2.6 g/dL — AB (ref 3.5–5.0)
ALK PHOS: 74 U/L (ref 38–126)
ALT: 36 U/L (ref 17–63)
AST: 84 U/L — AB (ref 15–41)
Anion gap: 4 — ABNORMAL LOW (ref 5–15)
BILIRUBIN TOTAL: 1 mg/dL (ref 0.3–1.2)
BUN: 26 mg/dL — AB (ref 6–20)
CALCIUM: 7.8 mg/dL — AB (ref 8.9–10.3)
CO2: 26 mmol/L (ref 22–32)
Chloride: 109 mmol/L (ref 101–111)
Creatinine, Ser: 0.96 mg/dL (ref 0.61–1.24)
GFR calc Af Amer: >60 mL/min (ref >60)
GFR calc non Af Amer: >60 mL/min (ref >60)
GLUCOSE: 118 mg/dL — AB (ref 65–99)
Potassium: 4.2 mmol/L (ref 3.5–5.1)
Sodium: 139 mmol/L (ref 135–145)
TOTAL PROTEIN: 5.9 g/dL — AB (ref 6.5–8.1)

## 2016-01-04 LAB — CBC
HEMATOCRIT: 40.6 % (ref 40.0–52.0)
Hemoglobin: 14.1 g/dL (ref 13.0–18.0)
MCH: 37.9 pg — AB (ref 26.0–34.0)
MCHC: 34.8 g/dL (ref 32.0–36.0)
MCV: 108.8 fL — ABNORMAL HIGH (ref 80.0–100.0)
Platelets: 48 10*3/uL — ABNORMAL LOW (ref 150–440)
RBC: 3.73 MIL/uL — ABNORMAL LOW (ref 4.40–5.90)
RDW: 14.3 % (ref 11.5–14.5)
WBC: 10.5 10*3/uL (ref 3.8–10.6)

## 2016-01-04 LAB — GLUCOSE, CAPILLARY
Glucose-Capillary: 106 mg/dL — ABNORMAL HIGH (ref 65–99)
Glucose-Capillary: 115 mg/dL — ABNORMAL HIGH (ref 65–99)

## 2016-01-04 LAB — PHOSPHORUS: PHOSPHORUS: 3.4 mg/dL (ref 2.5–4.6)

## 2016-01-04 LAB — MAGNESIUM: MAGNESIUM: 2.1 mg/dL (ref 1.7–2.4)

## 2016-01-04 MED ORDER — SODIUM CHLORIDE 0.9 % IV SOLN
10.0000 mg/h | INTRAVENOUS | Status: DC
  Administered 2016-01-04: 10 mg/h via INTRAVENOUS
  Filled 2016-01-04: qty 10

## 2016-01-04 MED ORDER — MORPHINE BOLUS VIA INFUSION
5.0000 mg | INTRAVENOUS | Status: DC | PRN
  Filled 2016-01-04: qty 20

## 2016-01-04 MED ORDER — MORPHINE 100MG IN NS 100ML (1MG/ML) PREMIX INFUSION
10.0000 mg/h | INTRAVENOUS | Status: DC
  Administered 2016-01-04: 10 mg/h via INTRAVENOUS
  Filled 2016-01-04: qty 100

## 2016-01-04 MED ORDER — MIDAZOLAM BOLUS VIA INFUSION (WITHDRAWAL LIFE SUSTAINING TX)
5.0000 mg | INTRAVENOUS | Status: DC | PRN
  Filled 2016-01-04: qty 20

## 2016-01-04 MED FILL — Medication: Qty: 1 | Status: AC

## 2016-01-08 LAB — CULTURE, BLOOD (ROUTINE X 2): Culture: NO GROWTH

## 2016-01-12 NOTE — Progress Notes (Signed)
Pt. Is comfort care now,exubated and removed from vent.

## 2016-01-12 NOTE — Progress Notes (Signed)
PULMONARY / CRITICAL CARE MEDICINE   Name: Kyle Gomez MRN: [id number] DOB: [birth year]    ADMISSION DATE:  01/05/2016  BRIEF HISTORY: 55 yo  Male with past medical history significant for alcoholism, anxiety, bipolar disorder, chronic pain, depression, liver cirrhosis, GERD. Apparently patient had an argument with his wife. Patient told his wife that he was going out to get some Heroin. Wife states that patient has been complaining of chest pain all week. In the meantime wife went to take a bath. When she came back she found him unresponsive and began CPR until EMS arrived. EMS found the patient in asystole. EMS continued CPR. Patient was given 3 doses of epi, 1 amp of D50 and 2 mg of Narcan. EMS reports ROSC after completion of Third round of epinephrine  SUBJECTIVE:  No acute events overnight. Plan is terminally wean today  VITAL SIGNS: Temp:  [96.8 F (36 C)-100.2 F (37.9 C)] 98.6 F (37 C) (09/23 0000) Pulse Rate:  [63-94] 72 (09/23 0000) Resp:  [16-33] 16 (09/23 0000) BP: (86-162)/(63-107) 126/72 (09/23 0000) SpO2:  [96 %-100 %] 98 % (09/23 0000) FiO2 (%):  [35 %] 35 % (09/22 2349) Weight:  [240 lb 1.3 oz (108.9 kg)] 240 lb 1.3 oz (108.9 kg) (09/22 0426) HEMODYNAMICS: CVP:  [6 mmHg-17 mmHg] 9 mmHg VENTILATOR SETTINGS: Vent Mode: PRVC FiO2 (%):  [35 %] 35 % Set Rate:  [14 bmp] 14 bmp Vt Set:  [500 mL] 500 mL PEEP:  [5 cmH20] 5 cmH20 INTAKE / OUTPUT:  Intake/Output Summary (Last 24 hours) at Jan 14, 2016 0302 Last data filed at 2016/01/14 0000  Gross per 24 hour  Intake          2233.33 ml  Output              960 ml  Net          1273.33 ml    Review of Systems  Unable to perform ROS: Critical illness    Physical Exam  Constitutional: He is well-developed, well-nourished, and in no distress.  HENT:  Head: Normocephalic and atraumatic.  Right Ear: External ear normal.  Left Ear: External ear normal.  Neck: Neck supple.  Cardiovascular: Normal rate and regular  rhythm.   Pulmonary/Chest:  On MV, mild shallow BS, but clear  Abdominal: Soft. Bowel sounds are normal.  Musculoskeletal: He exhibits no edema.  Neurological:  Cannot assess-intubated and sedated  Skin: Skin is warm and dry.  Nursing note and vitals reviewed.  LABS:  CBC  Recent Labs Lab 01/02/16 0132 01/02/16 0650 01/03/16 0416  WBC 14.7* 15.4* 14.6*  HGB 16.2 15.3 14.8  HCT 48.0 45.7 43.6  PLT 75* 62* 46*   Coag's  Recent Labs Lab 01/02/16 0132  APTT 38*  INR 1.55   BMET  Recent Labs Lab 01/02/16 0514 01/02/16 2010 01/03/16 0416  NA 141 137 139  K 3.3* 3.9 3.6  CL 112* 109 110  CO2 22 23 21*  BUN _0 CREATININE 1.34* 1.17 1.00  GLUCOSE 190* 120* 114*   Electrolytes  Recent Labs Lab 01/02/16 0514  01/02/16 2010 01/03/16 0416 01/03/16 1649  CALCIUM 7.2*  --  7.8* 7.7*  --   MG 1.6*  < > 2.3 2.1 2.1  PHOS 2.5  < > 2.9 3.1 3.2  < > = values in this interval not displayed. Sepsis Markers  Recent Labs Lab 12/16/2015 2306 01/02/16 0140  LATICACIDVEN 5.0* 2.4*   ABG  Recent Labs Lab  01/05/2016 2319 01/02/16 0555 01/03/16 0415  PHART 7.32* 7.34* 7.36  PCO2ART 32 38 37  PO2ART 505* 138* 100   Liver Enzymes  Recent Labs Lab 01/07/2016 2236 01/03/16 0416  AST 117*  --   ALT 61  --   ALKPHOS 109  --   BILITOT 1.4*  --   ALBUMIN 3.6 2.9*   Cardiac Enzymes  Recent Labs Lab 01/02/16 1157 01/02/16 1814 01/02/16 2012  TROPONINI 0.28* 0.20* 0.15*   Glucose  Recent Labs Lab 01/03/16 0355 01/03/16 0734 01/03/16 1115 01/03/16 1618 01/03/16 1938 01/03/16 2346  GLUCAP 96 109* 130* 91 125* 103*    Imaging Ct Head Wo Contrast  Result Date: 01/03/2016 CLINICAL DATA:  55 year old male with a history of cardiac arrest with CPR. Found unresponsive. Now intubated. EXAM: CT HEAD WITHOUT CONTRAST TECHNIQUE: Contiguous axial images were obtained from the base of the skull through the vertex without intravenous contrast. COMPARISON:   12/15/2015 FINDINGS: Brain: Since the prior CT, there has been loss of gray-white differentiation and effacement of the sulci of the bilateral hemispheres, most pronounced at the posterior aspects involving temporal, posterior frontal and parietal region. There has been development of focal hypodensity in the globus pallidus of the bilateral basal ganglia. Differential attenuation of the cerebral hemispheres compared to the cerebellum ("white cerebellum sign"). No acute intracranial hemorrhage. No midline shift. No herniation identified this time, with basal cisterns unchanged from the prior. Vascular: No significant intracranial calcifications. Skull: No acute fracture or aggressive lesion. Sinuses/Orbits: Near complete opacification of ethmoid air cells. Layered fluid within the sphenoid sinuses. The visualized nasopharynx is opacified. Other: None IMPRESSION: Compared to the prior CT, there are now changes of developing diffuse cerebral edema, most pronounced posteriorly, compatible with anoxic brain injury. No herniation at this time, and no hemorrhage. Focal hypodensity in the bilateral basal ganglia, can be seen either with anoxic injury or toxic exposure. MRI would be indicated for further characterization of the abnormalities. These results were called by telephone at the time of interpretation on 01/03/2016 at 2:42 pm to Dr. Vilinda Boehringer , who verbally acknowledged these results. Signed, Dulcy Fanny. Earleen Newport, DO [facility] Electronically Signed   By: Corrie Mckusick D.O.   On: 01/03/2016 14:55   Ct Angio Chest Pe W Or Wo Contrast  Result Date: 01/03/2016 CLINICAL DATA:  Recent cardiac arrest following apparent overdose EXAM: CT ANGIOGRAPHY CHEST WITH CONTRAST TECHNIQUE: Multidetector CT imaging of the chest was performed using the standard protocol during bolus administration of intravenous contrast. Multiplanar CT image reconstructions and MIPs were  obtained to evaluate the vascular anatomy. CONTRAST:  100 mL Isovue 370. COMPARISON:  None FINDINGS: Cardiovascular: The thoracic aorta shows no significant calcifications. No aneurysmal dilatation or dissection is noted. The pulmonary artery is well visualized and demonstrates a normal branching pattern. No definitive filling defects are noted to suggest pulmonary embolism. Mediastinum/Nodes: Endotracheal tube and nasogastric catheter are noted in satisfactory position. The esophagus as visualize is within normal limits. No significant hilar or mediastinal adenopathy is noted. The thoracic inlet is unremarkable. Lungs/Pleura: Lungs are well aerated bilaterally although left lower lobe and right lower lobe consolidation is noted. Changes are somewhat more marked on the left than the right. No significant effusion is seen. Upper Abdomen: Within normal limits. Musculoskeletal: No chest wall abnormality. No acute or significant osseous findings. Review of the MIP images confirms the above findings. IMPRESSION: Bilateral lower lobe consolidation left greater than right. No evidence of  aortic or pulmonary arterial abnormality. Electronically Signed   By: Inez Catalina M.D.   On: 01/03/2016 14:36   STUDIES:  9/20 urine drug screen>> positive for benzos and opiates 9/21 CT Head> Neg 9/22 CTA chest> 9/21 Echo>EF 60-65%, nl LV, PASP 57, G1 diastolic dysfunction 3/15 CT Head> grey-white matter blunting, early cerebral edema posteriorly and diffusely SIGNIFICANT EVENTS: 9/20>cardiac arrest 20-25 downtime, hypothermia protocol, asystole/PEA 9/22>rewarming, CTA chest neg for PE, CT head>early anoxic brain injury  LINES: 9/21 right femoral TLC>>  CULTURES: Bld Cx 9/22>  ANTIBIOTICS: Rocephin 9/21>9/24 Azithro 9/21>9/26   DISCUSSION: 55 year old male with PEA cardiac arrest, unknown downtime, currently on hypothermia   ASSESSMENT / PLAN:  PULMONARY A Acute respiratory failure  Status post PEA  arrest Bilateral Lower Lobes consolidation - CAP P Full vent support,  Hypothermia 36-completed; monitor temp Fentanyl/Versed for sedation Routine ABG  CARDIOVASCULAR A:  Cardiac arrest-exact cause unclear Asystole PEA arrest Troponin elevation Elevated blood pressure  P:  Prn Hydralazine Continuous telemetry keep map goals >65 9/21 Echo>EF 60-65%, nl LV, PASP 57, G1 diastolic dysfunction Trend troponin  RENAL A: Acute kidney injury possibly related to cardiac arrest Hyperkalemia Low UOP P:   Strict intake output Replace electrolytes per ICU protocol Cr improving Follow UOP  GASTROINTESTINAL A GERD History of hepatitis C Elevated liver enzymes History of liver cirrhosis Alcohol abuse P:   npo Follow liver enzymes Famotidine for GIP   HEMATOLOGIC A:   Thrombocytopenia-chronic  P:  SCDS Plts=48, continue to hold lovenox  SCDs for DVT prophylaxis Monitor Plts  INFECTIOUS A:   Leukocytosis r/t stress response P:   Monitor fever curve  ENDOCRINE A Hypothyroidism P:    continue levothyroxine  NEUROLOGICAL Acute encephalopathy status post PEA arrest Tobacco abuse/alcohol abuse Polysubstance dependence Opioid dependence Major depressive disorder  Chronic pain syndrome  P:   RASS goal -1 Neurology following EEG Pending 9/22 CT head with signs of early anoxic injury and diffuse cerebral edema  Disposition and family update: Met with patient's family in the room. Answered all questions they had regarding patient's prognosis and CT results. Patient is to be kept on the vent till 9am tomorrow when they plan on assembling the whole family for a one-way extubation.   Magdalene S. Tukov ANP-BC Pulmonary and [place] Pager [phone] or [phone]   STAFF NOTE: I, Dr. Vilinda Boehringer have personally reviewed patient's available data, including medical history, events of note, physical examination and  test results as part of my evaluation. I have discussed with NP Demaris Callander  and other care providers such as pharmacist, RN and RRT.     Prolonged discussion with family at bedside again this morning.  Poor prognosis. Family has decided on comfort care  .  Rest per NP/medical resident whose note is outlined above and that I agree with  The patient is critically ill with multiple organ systems failure and requires high complexity decision making for assessment and support, frequent evaluation and titration of therapies, application of advanced monitoring technologies and extensive interpretation of multiple databases.   Critical Care Time devoted to patient care services described in this note is  45 Minutes.   This time reflects time of care of this signee Dr Vilinda Boehringer.  This critical care time does not reflect procedure time, or teaching time or supervisory time of PA/NP/Med-student/Med Resident etc but could involve care discussion time.  Vilinda Boehringer, MD  Pulmonary and Critical Care Pager [phone]  please enter 7-digits) On Call Pager [phone] (please enter 7-digits)  Note: This note was prepared with Dragon dictation along with smaller phrase technology. Any transcriptional errors that result from this process are unintentional.

## 2016-01-12 NOTE — Progress Notes (Signed)
After discussion with Dr Dema SeverinMungal, wife and family members had decided on extubation and comfort care only.  Morphine and Versed gtts started just prior to extubation. Fentanyl gtt off. Soon after extubation, pt expired with family members and chaplain at bedside. See post mortem flow record.

## 2016-01-12 NOTE — Progress Notes (Signed)
   12/16/2015 1000  Clinical Encounter Type  Visited With Patient;Family;Health care provider  Visit Type Initial;Critical Care;Patient actively dying  Referral From Chaplain;Nurse  Spiritual Encounters  Spiritual Needs Prayer;Emotional;Grief support  Stress Factors  Patient Stress Factors Health changes;Major life changes  Family Stress Factors Health changes;Loss    CH responded to referral from On Call CH at change of shift. Pt was non responsive, but visited with family and family clergy. Wife, Mother, and other family members were faced with DNR decision. Per Doctor, it would be time. Family acknowledged and was waiting for extubation. CH provided the ministry of prayer, presence, and hospitality. CH is available 24/7 and will follow up as needed.

## 2016-01-12 NOTE — Progress Notes (Signed)
Update Family at bedside (Wife and sisters and brothers), updated on overnight events, and poor prognosis with cerebral edema, anoxic brain injury, and effacement of grey-white matter, they have decided on withdrawal of care and comfort care.   Chaplain and RN at bedside.   Stephanie AcreVishal Quintasia Theroux, MD  Pulmonary and Critical Care Pager [phone]- [phone] (please enter 7-digits) On Call Pager - [phone] (please enter 7-digits)

## 2016-01-12 NOTE — Discharge Summary (Signed)
Death Summary   Date of admission: 01/02/2016 Date of death: 11-05-15 Time of death: 10:30 AM  ADMISSION DATE:  01/03/2016 CONSULTATION DATE:  01/02/16  REFERRING MD: Dr. Chrissie NoaWilliam  CHIEF COMPLAINT:  PEA Arrest  HISTORY OF PRESENT ILLNESS 01/02/16:   Kyle Gomez is a 55 yo  Male with past medical history significant for alcoholism, anxiety, bipolar disorder, chronic pain, depression, liver cirrhosis, GERD. Apparently patient had an argument with his wife. Patient told his wife that he was going out to get some Heroin. Wife states that patient has been complaining of chest pain all week. In the meantime wife went to take a bath. When she came back she found him unresponsive and began CPR until EMS arrived. EMS found the patient in asystole. EMS received CPR. Patient was given 3 doses of epi, 1 amp of D50 and 2 mg of Narcan. EMS reports ROSC after completion of Third round of epinephrine. EMS reports that estimated down time for 15 minutes before their arrival. Wife states that she does not know as she was in the bathroom. [facility] by endotracheal tube by Dr. Chrissie NoaWilliam. CT of the head was negative for any anoxic brain injury. Urine drug screen is positive for benzos and opiates. PCCM team was called to admit the patient.  Hospital summary: She was admitted to the ICU unit, placed on the hypothermia protocol, for 24 hours. He was started to be rewarmed on 01/03/2016 at 6 AM, he reached his target rewarmed and temperature within 2-3 hours. Later that afternoon he was noted to have elevations in blood pressure and with attempts to wean his sedation and analgesia, he was noted to have severe episodes of tachypnea and systolic blood pressures in the 160s-180s; patient was noted to have a depressed gag reflex on evaluation. Later on the afternoon of September 22 a gag reflex was checked again and he was noted to have no gag reflex. A CT of the head was ordered which showed new  developing changes of diffuse cerebral edema, most pronounced posteriorly, compatible with anoxic brain injury. A CT of the chest was also done that did not show any pulmonary embolus. Neurology also saw and evaluated the patient, stated that patient had moderate, early anoxic brain injury with cerebral edema that would progressively get worse. Throughout his hospitalization he continued to have elevated blood pressures elevations followed by sudden drop in blood pressure compatible with anoxic brain injury. On the morning of 11-05-15, family including mother, wife and brothers decided on comfort care and withdrawal of care.  Time of death 10:30 AM Date of death 11-05-15  Problem list: Cardiac arrest-PEA/asystole Anoxic brain injury Elevated liver enzymes History of hepatitis C Heroin use Alcohol use Cirrhosis of the liver Rectum cytopenia Thyroid disease History of rectal bleeding Prostate disease Eczema Erectile dysfunction GERD    Stephanie AcreVishal Jonthan Leite, MD [place] Pulmonary and Critical Care Pager [phone]- [phone] (please enter 7-digits) On Call Pager - [phone] (please enter 7-digits)

## 2016-01-12 NOTE — Clinical Social Work Note (Signed)
CSW received consult for comfort care. CSW will follow for any emotional support needs.   Kyle PonderKaren Martha [name], MSW, LCSW-A [phone]

## 2016-01-12 DEATH — deceased

## 2016-01-14 LAB — CULTURE, BLOOD (ROUTINE X 2): Culture: NO GROWTH

## 2017-02-04 IMAGING — CT CT ANGIO CHEST
2 of 6 series · 18 of 46 positions shown · IV contrast (APPLIED)
Comparison: None

CLINICAL DATA: Recent cardiac arrest following apparent overdose

EXAM:
CT ANGIOGRAPHY CHEST WITH CONTRAST
TECHNIQUE: Multidetector CT imaging of the chest was performed using the
standard protocol during bolus administration of intravenous
contrast. Multiplanar CT image reconstructions and MIPs were
obtained to evaluate the vascular anatomy.
CONTRAST:  100 mL Isovue 370.

[Series 6: thins · axial · 0.83mm/px · z∈[-589,-343]mm · 16 of 270 slices shown]
[im 12/270  lung]
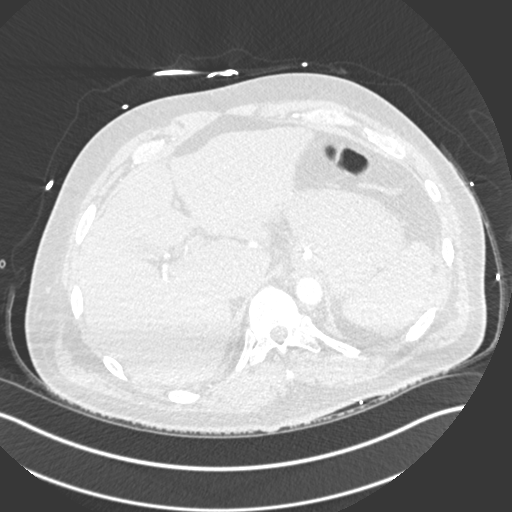
[im 36/270  soft-tissue]
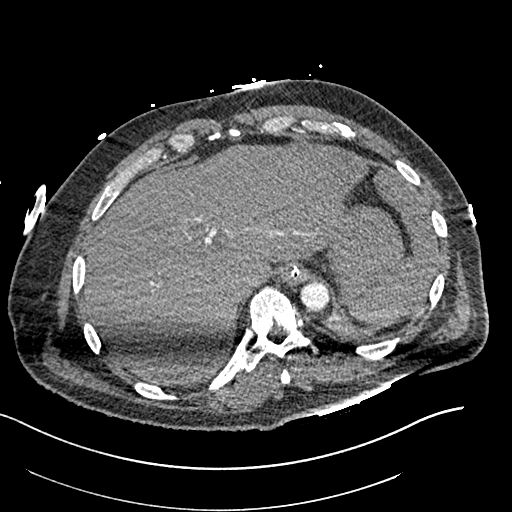
[im 47/270  lung]
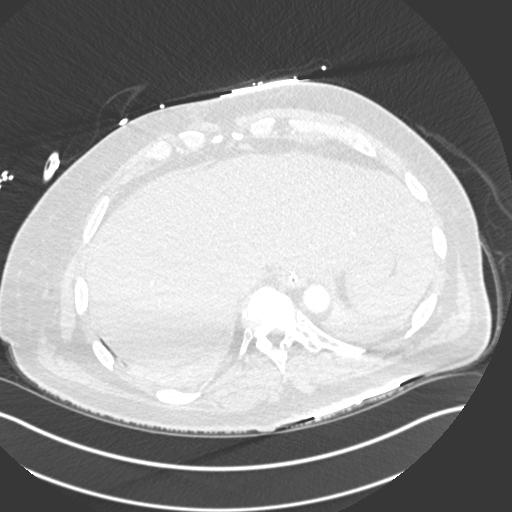
[im 59/270  soft-tissue]
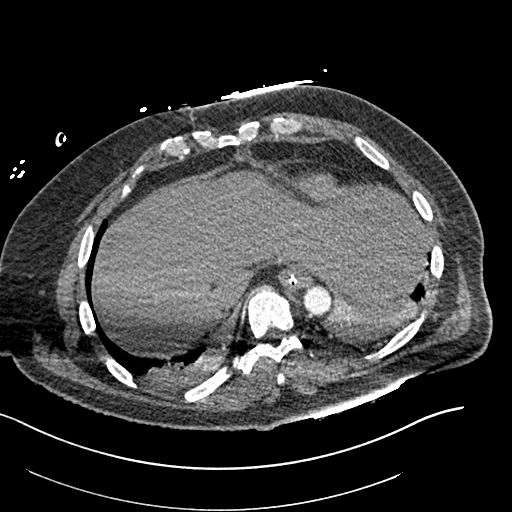
[im 82/270  lung]
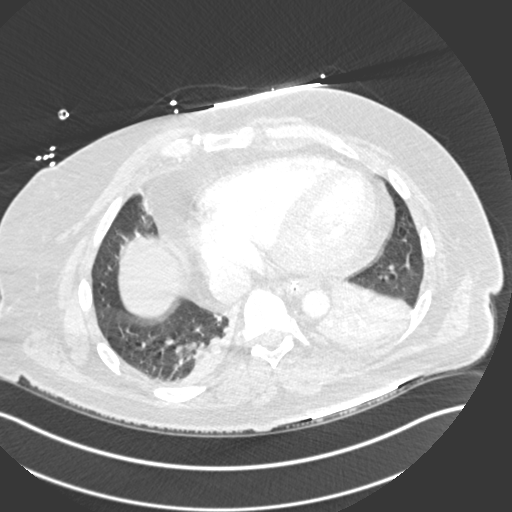
[im 94/270  soft-tissue]
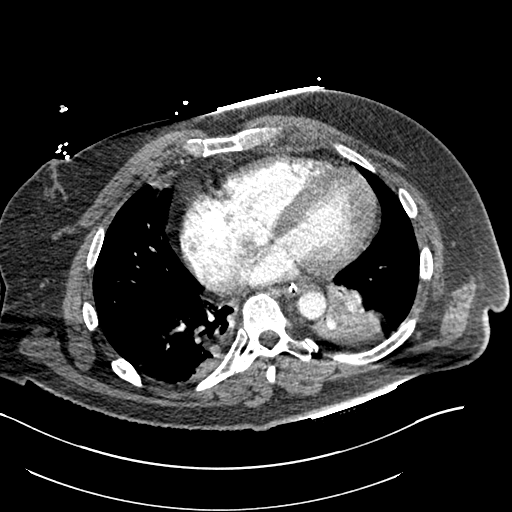
[im 106/270  lung]
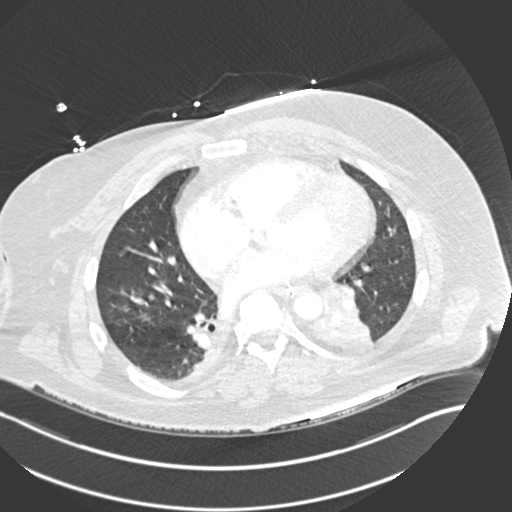
[im 129/270  soft-tissue]
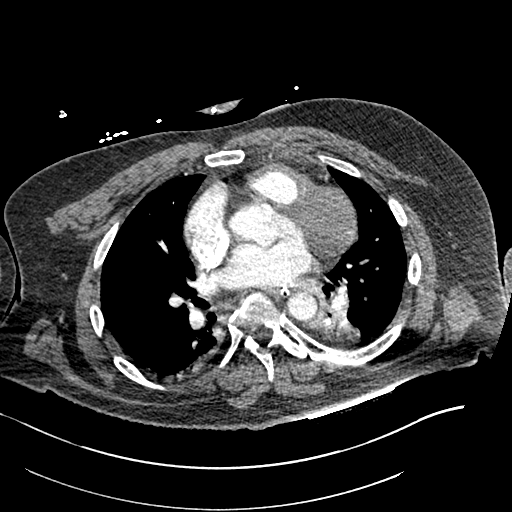
[im 141/270  lung]
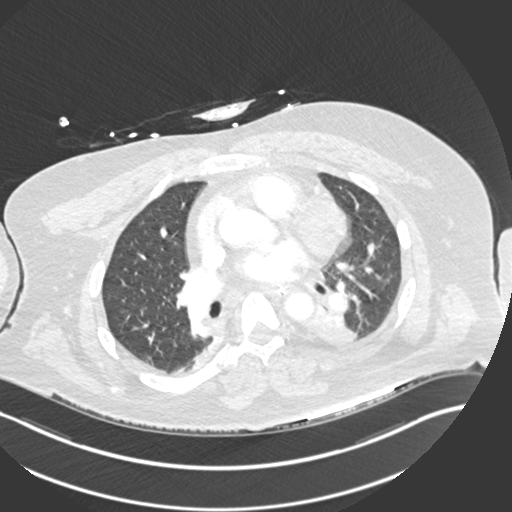
[im 164/270  soft-tissue]
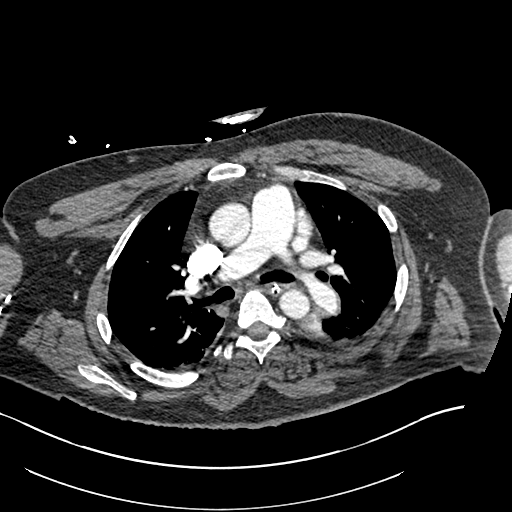
[im 176/270  lung]
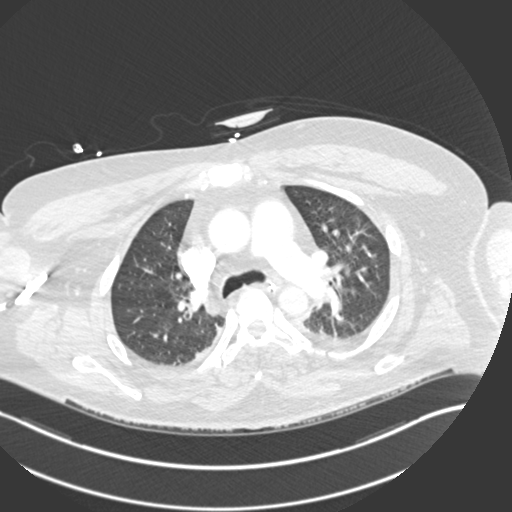
[im 188/270  soft-tissue]
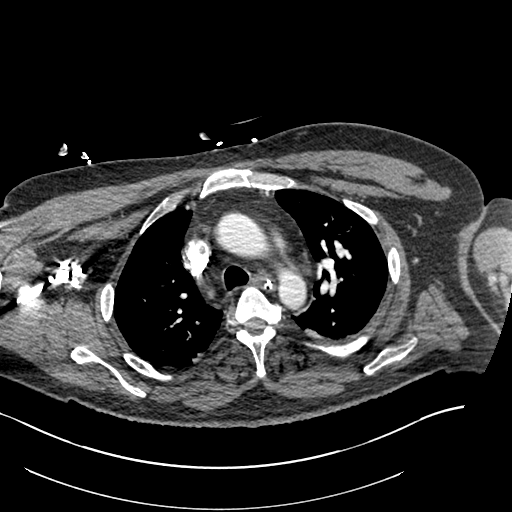
[im 211/270  lung]
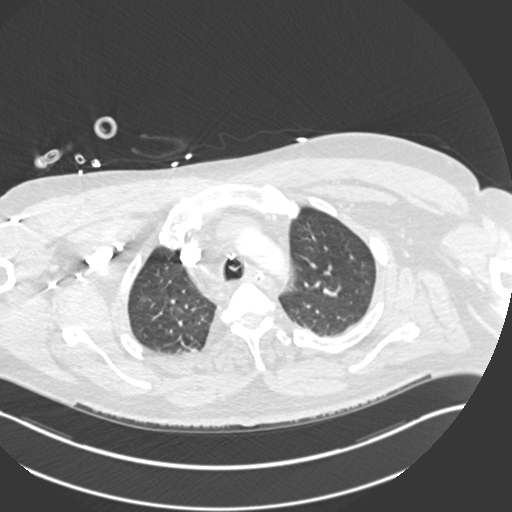
[im 223/270  soft-tissue]
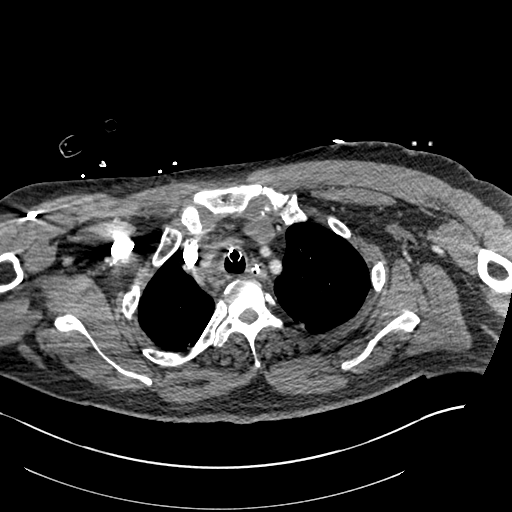
[im 234/270  lung]
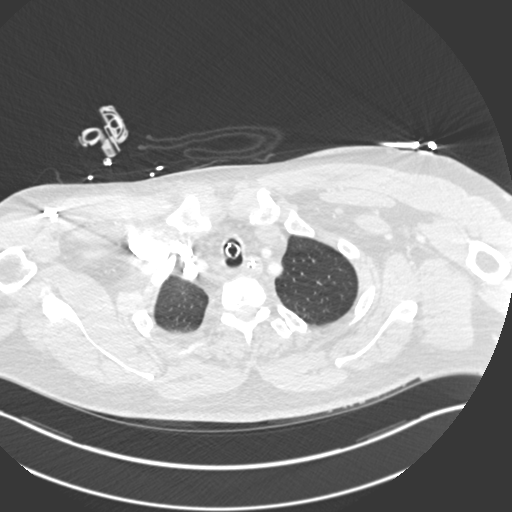
[im 258/270  soft-tissue]
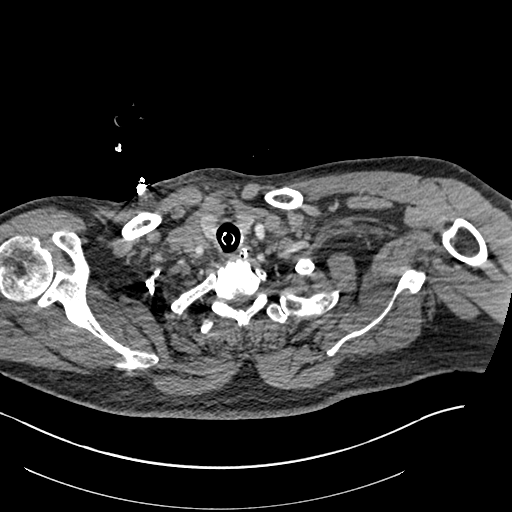

[Series 8: coronal mpr · coronal · 0.56mm/px · 2 of 90 slices shown]
[im 30/90  soft-tissue]
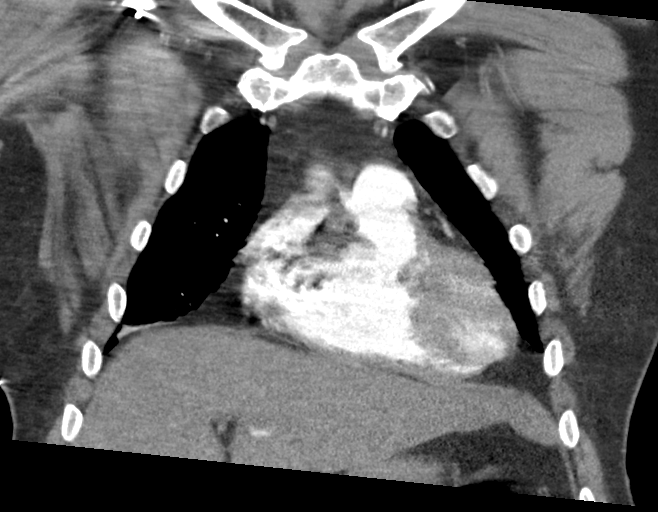
[im 60/90  soft-tissue]
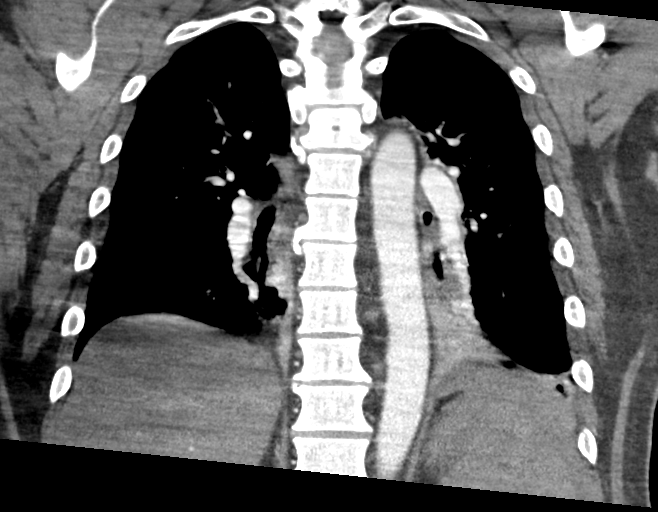

[18 of 46 positions shown; findings below may reference images not displayed]

FINDINGS: Cardiovascular: The thoracic aorta shows no significant
calcifications. No aneurysmal dilatation or dissection is noted. The
pulmonary artery is well visualized and demonstrates a normal
branching pattern. No definitive filling defects are noted to
suggest pulmonary embolism.

Mediastinum/Nodes: Endotracheal tube and nasogastric catheter are
noted in satisfactory position. The esophagus as visualize is within
normal limits. No significant hilar or mediastinal adenopathy is
noted. The thoracic inlet is unremarkable.

Lungs/Pleura: Lungs are well aerated bilaterally although left lower
lobe and right lower lobe consolidation is noted. Changes are
somewhat more marked on the left than the right. No significant
effusion is seen.

Upper Abdomen: Within normal limits.

Musculoskeletal: No chest wall abnormality. No acute or significant
osseous findings.

Review of the MIP images confirms the above findings.
IMPRESSION: Bilateral lower lobe consolidation left greater than right.

No evidence of aortic or pulmonary arterial abnormality.
